# Patient Record
Sex: Female | Born: 1940 | Race: White | Hispanic: No | Smoking: Never smoker
Health system: Southern US, Community
[De-identification: ages and names within clinical notes are randomized; demographics above are authoritative.]

## PROBLEM LIST (undated history)

## (undated) DIAGNOSIS — K219 Gastro-esophageal reflux disease without esophagitis: Secondary | ICD-10-CM

## (undated) DIAGNOSIS — M1990 Unspecified osteoarthritis, unspecified site: Secondary | ICD-10-CM

## (undated) DIAGNOSIS — Z860100 Personal history of colon polyps, unspecified: Secondary | ICD-10-CM

## (undated) DIAGNOSIS — E785 Hyperlipidemia, unspecified: Secondary | ICD-10-CM

## (undated) DIAGNOSIS — I10 Essential (primary) hypertension: Secondary | ICD-10-CM

## (undated) DIAGNOSIS — E039 Hypothyroidism, unspecified: Secondary | ICD-10-CM

## (undated) DIAGNOSIS — Z01419 Encounter for gynecological examination (general) (routine) without abnormal findings: Secondary | ICD-10-CM

## (undated) HISTORY — DX: Encounter for gynecological examination (general) (routine) without abnormal findings: Z01.419

## (undated) HISTORY — DX: Essential (primary) hypertension: I10

## (undated) HISTORY — DX: Hypothyroidism, unspecified: E03.9

## (undated) HISTORY — PX: OTHER SURGICAL HISTORY: SHX169

## (undated) HISTORY — PX: BREAST LUMPECTOMY: SHX2

## (undated) HISTORY — DX: Gastro-esophageal reflux disease without esophagitis: K21.9

## (undated) HISTORY — DX: Hyperlipidemia, unspecified: E78.5

## (undated) HISTORY — DX: Unspecified osteoarthritis, unspecified site: M19.90

## (undated) HISTORY — DX: Personal history of colon polyps, unspecified: Z86.0100

## (undated) HISTORY — PX: OOPHORECTOMY: SHX86

## (undated) HISTORY — DX: Personal history of colonic polyps: Z86.010

## (undated) HISTORY — PX: ABDOMINAL HYSTERECTOMY: SHX81

---

## 1998-01-08 ENCOUNTER — Other Ambulatory Visit: Admission: RE | Admit: 1998-01-08 | Discharge: 1998-01-08 | Payer: Self-pay | Admitting: *Deleted

## 1998-09-29 ENCOUNTER — Emergency Department (HOSPITAL_COMMUNITY): Admission: EM | Admit: 1998-09-29 | Discharge: 1998-09-29 | Payer: Self-pay | Admitting: Emergency Medicine

## 1998-10-16 ENCOUNTER — Emergency Department (HOSPITAL_COMMUNITY): Admission: EM | Admit: 1998-10-16 | Discharge: 1998-10-16 | Payer: Self-pay | Admitting: Emergency Medicine

## 1998-10-18 ENCOUNTER — Emergency Department (HOSPITAL_COMMUNITY): Admission: EM | Admit: 1998-10-18 | Discharge: 1998-10-18 | Payer: Self-pay | Admitting: Emergency Medicine

## 1999-05-21 ENCOUNTER — Other Ambulatory Visit: Admission: RE | Admit: 1999-05-21 | Discharge: 1999-05-21 | Payer: Self-pay | Admitting: *Deleted

## 2000-01-20 ENCOUNTER — Other Ambulatory Visit: Admission: RE | Admit: 2000-01-20 | Discharge: 2000-01-20 | Payer: Self-pay | Admitting: *Deleted

## 2000-08-04 ENCOUNTER — Other Ambulatory Visit: Admission: RE | Admit: 2000-08-04 | Discharge: 2000-08-04 | Payer: Self-pay | Admitting: *Deleted

## 2002-01-01 ENCOUNTER — Emergency Department (HOSPITAL_COMMUNITY): Admission: EM | Admit: 2002-01-01 | Discharge: 2002-01-02 | Payer: Self-pay | Admitting: Emergency Medicine

## 2004-09-29 ENCOUNTER — Ambulatory Visit: Payer: Self-pay | Admitting: Family Medicine

## 2006-12-06 ENCOUNTER — Encounter: Payer: Self-pay | Admitting: Family Medicine

## 2006-12-06 DIAGNOSIS — Z860100 Personal history of colon polyps, unspecified: Secondary | ICD-10-CM | POA: Insufficient documentation

## 2006-12-06 DIAGNOSIS — M19019 Primary osteoarthritis, unspecified shoulder: Secondary | ICD-10-CM | POA: Insufficient documentation

## 2006-12-06 DIAGNOSIS — D869 Sarcoidosis, unspecified: Secondary | ICD-10-CM | POA: Insufficient documentation

## 2006-12-06 DIAGNOSIS — E0590 Thyrotoxicosis, unspecified without thyrotoxic crisis or storm: Secondary | ICD-10-CM | POA: Insufficient documentation

## 2006-12-06 DIAGNOSIS — I10 Essential (primary) hypertension: Secondary | ICD-10-CM | POA: Insufficient documentation

## 2006-12-06 DIAGNOSIS — M810 Age-related osteoporosis without current pathological fracture: Secondary | ICD-10-CM | POA: Insufficient documentation

## 2006-12-23 ENCOUNTER — Ambulatory Visit: Payer: Self-pay | Admitting: Family Medicine

## 2006-12-23 DIAGNOSIS — E039 Hypothyroidism, unspecified: Secondary | ICD-10-CM | POA: Insufficient documentation

## 2006-12-28 LAB — CONVERTED CEMR LAB
ALT: 18 units/L (ref 0–35)
AST: 22 units/L (ref 0–37)
Albumin: 4.4 g/dL (ref 3.5–5.2)
Alkaline Phosphatase: 70 units/L (ref 39–117)
BUN: 12 mg/dL (ref 6–23)
Basophils Absolute: 0 10*3/uL (ref 0.0–0.1)
Basophils Relative: 0.1 % (ref 0.0–1.0)
Bilirubin, Direct: 0.1 mg/dL (ref 0.0–0.3)
CO2: 29 meq/L (ref 19–32)
Calcium: 9.7 mg/dL (ref 8.4–10.5)
Chloride: 102 meq/L (ref 96–112)
Cholesterol: 254 mg/dL (ref 0–200)
Creatinine, Ser: 0.8 mg/dL (ref 0.4–1.2)
Direct LDL: 179.8 mg/dL
Eosinophils Absolute: 0.1 10*3/uL (ref 0.0–0.6)
Eosinophils Relative: 1.6 % (ref 0.0–5.0)
GFR calc Af Amer: 92 mL/min
GFR calc non Af Amer: 76 mL/min
Glucose, Bld: 106 mg/dL — ABNORMAL HIGH (ref 70–99)
HCT: 40.5 % (ref 36.0–46.0)
HDL: 44.8 mg/dL (ref >39.0)
Hemoglobin: 14.1 g/dL (ref 12.0–15.0)
Lymphocytes Relative: 36 % (ref 12.0–46.0)
MCHC: 34.9 g/dL (ref 30.0–36.0)
MCV: 90.6 fL (ref 78.0–100.0)
Monocytes Absolute: 0.5 10*3/uL (ref 0.2–0.7)
Monocytes Relative: 10.7 % (ref 3.0–11.0)
Neutro Abs: 2.7 10*3/uL (ref 1.4–7.7)
Neutrophils Relative %: 51.6 % (ref 43.0–77.0)
Platelets: 224 10*3/uL (ref 150–400)
Potassium: 3.5 meq/L (ref 3.5–5.1)
RBC: 4.47 M/uL (ref 3.87–5.11)
RDW: 11.2 % — ABNORMAL LOW (ref 11.5–14.6)
Sodium: 138 meq/L (ref 135–145)
TSH: 1.57 microintl units/mL (ref 0.35–5.50)
Total Bilirubin: 0.8 mg/dL (ref 0.3–1.2)
Total CHOL/HDL Ratio: 5.7
Total Protein: 7.6 g/dL (ref 6.0–8.3)
Triglycerides: 165 mg/dL — ABNORMAL HIGH (ref 0–149)
VLDL: 33 mg/dL (ref 0–40)
WBC: 5.1 10*3/uL (ref 4.5–10.5)

## 2007-01-06 ENCOUNTER — Telehealth: Payer: Self-pay | Admitting: Family Medicine

## 2007-01-24 ENCOUNTER — Ambulatory Visit: Payer: Self-pay | Admitting: Family Medicine

## 2007-06-20 ENCOUNTER — Ambulatory Visit: Payer: Self-pay | Admitting: Family Medicine

## 2007-06-20 DIAGNOSIS — K219 Gastro-esophageal reflux disease without esophagitis: Secondary | ICD-10-CM | POA: Insufficient documentation

## 2007-07-18 ENCOUNTER — Ambulatory Visit: Payer: Self-pay | Admitting: Family Medicine

## 2008-02-07 ENCOUNTER — Ambulatory Visit: Payer: Self-pay | Admitting: Family Medicine

## 2008-06-14 ENCOUNTER — Ambulatory Visit: Payer: Self-pay | Admitting: Family Medicine

## 2008-06-14 DIAGNOSIS — I499 Cardiac arrhythmia, unspecified: Secondary | ICD-10-CM | POA: Insufficient documentation

## 2008-07-03 ENCOUNTER — Ambulatory Visit: Payer: Self-pay | Admitting: Family Medicine

## 2008-07-04 LAB — CONVERTED CEMR LAB
ALT: 17 units/L (ref 0–35)
AST: 19 units/L (ref 0–37)
Albumin: 4.3 g/dL (ref 3.5–5.2)
Alkaline Phosphatase: 67 units/L (ref 39–117)
BUN: 22 mg/dL (ref 6–23)
Basophils Absolute: 0 10*3/uL (ref 0.0–0.1)
Basophils Relative: 0.2 % (ref 0.0–3.0)
Bilirubin, Direct: 0.1 mg/dL (ref 0.0–0.3)
CO2: 29 meq/L (ref 19–32)
Calcium: 9.8 mg/dL (ref 8.4–10.5)
Chloride: 105 meq/L (ref 96–112)
Cholesterol: 257 mg/dL (ref 0–200)
Creatinine, Ser: 0.9 mg/dL (ref 0.4–1.2)
Direct LDL: 175.8 mg/dL
Eosinophils Absolute: 0.2 10*3/uL (ref 0.0–0.7)
Eosinophils Relative: 3 % (ref 0.0–5.0)
GFR calc Af Amer: 80 mL/min
GFR calc non Af Amer: 66 mL/min
Glucose, Bld: 101 mg/dL — ABNORMAL HIGH (ref 70–99)
HCT: 40.6 % (ref 36.0–46.0)
HDL: 40.6 mg/dL (ref >39.0)
Hemoglobin: 13.9 g/dL (ref 12.0–15.0)
Lymphocytes Relative: 44.8 % (ref 12.0–46.0)
MCHC: 34.2 g/dL (ref 30.0–36.0)
MCV: 92 fL (ref 78.0–100.0)
Monocytes Absolute: 0.6 10*3/uL (ref 0.1–1.0)
Monocytes Relative: 12.1 % — ABNORMAL HIGH (ref 3.0–12.0)
Neutro Abs: 2.1 10*3/uL (ref 1.4–7.7)
Neutrophils Relative %: 39.9 % — ABNORMAL LOW (ref 43.0–77.0)
Platelets: 203 10*3/uL (ref 150–400)
Potassium: 3.9 meq/L (ref 3.5–5.1)
RBC: 4.41 M/uL (ref 3.87–5.11)
RDW: 11.2 % — ABNORMAL LOW (ref 11.5–14.6)
Sodium: 141 meq/L (ref 135–145)
TSH: 1.48 microintl units/mL (ref 0.35–5.50)
Total Bilirubin: 1 mg/dL (ref 0.3–1.2)
Total CHOL/HDL Ratio: 6.3
Total Protein: 7.3 g/dL (ref 6.0–8.3)
Triglycerides: 160 mg/dL — ABNORMAL HIGH (ref 0–149)
VLDL: 32 mg/dL (ref 0–40)
WBC: 5.2 10*3/uL (ref 4.5–10.5)

## 2008-07-17 ENCOUNTER — Ambulatory Visit: Payer: Self-pay | Admitting: Cardiology

## 2008-11-28 ENCOUNTER — Telehealth (INDEPENDENT_AMBULATORY_CARE_PROVIDER_SITE_OTHER): Payer: Self-pay | Admitting: *Deleted

## 2009-01-16 ENCOUNTER — Ambulatory Visit: Payer: Self-pay | Admitting: Family Medicine

## 2009-01-30 ENCOUNTER — Ambulatory Visit: Payer: Self-pay | Admitting: Family Medicine

## 2009-06-21 ENCOUNTER — Telehealth: Payer: Self-pay | Admitting: Family Medicine

## 2010-01-15 ENCOUNTER — Telehealth: Payer: Self-pay | Admitting: Family Medicine

## 2010-01-22 ENCOUNTER — Ambulatory Visit: Payer: Self-pay | Admitting: Family Medicine

## 2010-05-29 NOTE — Assessment & Plan Note (Signed)
Summary: 1 MONTH FUP/PER DR/CCM   Vital Signs:  Patient Profile:   70 Years Old Female Weight:      115 pounds Temp:     98.2 degrees F oral Pulse rate:   40 / minute Pulse rhythm:   regular BP sitting:   142 / 84  (left arm)  Vitals Entered By: Alfred Levins, CMA (January 24, 2007 10:04 AM)                 Chief Complaint:  f/u on meds.  History of Present Illness: Here to follow up BP. We recently added Toprol XL to her Lotensin  HCT. Feels much better, and BP is much better.  Current Allergies: ! PENICILLIN ! SULFA ! DOXYCYCLINE ! * MONOGESIC ! * HYCOSAMINE ! * IOPHAN COUGH SYRUP  Past Medical History:    Reviewed history from 12/23/2006 and no changes required:       Osteoporosis       Hypertension       Hypothyroidism     Review of Systems  The patient denies anorexia, fever, weight loss, vision loss, decreased hearing, hoarseness, chest pain, syncope, dyspnea on exhertion, peripheral edema, prolonged cough, hemoptysis, abdominal pain, melena, hematochezia, severe indigestion/heartburn, hematuria, incontinence, genital sores, muscle weakness, suspicious skin lesions, transient blindness, difficulty walking, depression, unusual weight change, abnormal bleeding, enlarged lymph nodes, angioedema, breast masses, and testicular masses.     Physical Exam  General:     Well-developed,well-nourished,in no acute distress; alert,appropriate and cooperative throughout examination    Impression & Recommendations:  Problem # 1:  HYPERTENSION (ICD-401.9) Assessment: Improved  Her updated medication list for this problem includes:    Lotensin Hct 20-12.5 Mg Tabs (Benazepril-hydrochlorothiazide) ..... Once daily    Toprol Xl 50 Mg Tb24 (Metoprolol succinate) .Marland Kitchen... 1 by mouth once daily   Problem # 2:  HYPOTHYROIDISM (ICD-244.9) Assessment: Unchanged  Her updated medication list for this problem includes:    Synthroid 50 Mcg Tabs (Levothyroxine sodium) .Marland Kitchen... 1  by mouth once daily   Complete Medication List: 1)  Synthroid 50 Mcg Tabs (Levothyroxine sodium) .Marland Kitchen.. 1 by mouth once daily 2)  Centrum Tabs (Multiple vitamins-minerals) .Marland Kitchen.. 1 by mouth once daily 3)  Lotensin Hct 20-12.5 Mg Tabs (Benazepril-hydrochlorothiazide) .... Once daily 4)  Bayer Aspirin 325 Mg Tabs (Aspirin) .... Once daily 5)  Toprol Xl 50 Mg Tb24 (Metoprolol succinate) .Marland Kitchen.. 1 by mouth once daily   Patient Instructions: 1)  Please schedule a follow-up appointment as needed.    Prescriptions: SYNTHROID 50 MCG TABS (LEVOTHYROXINE SODIUM) 1 by mouth once daily  #30 x 11   Entered and Authorized by:   Nelwyn Salisbury MD   Signed by:   Nelwyn Salisbury MD on 01/24/2007   Method used:   Electronically sent to ...       [facility] [phone][facility]       [address]       [place], [place]  [address]       Ph: [phone] or [phone]       Fax: [phone]   RxID:   [id number]  ]

## 2010-05-29 NOTE — Assessment & Plan Note (Signed)
Summary: 1 month rov/njr   Vital Signs:  Patient Profile:   70 Years Old Female Weight:      115 pounds Temp:     98.2 degrees F oral Pulse rate:   52 / minute Pulse rhythm:   regular BP sitting:   118 / 74  (left arm) Cuff size:   regular  Vitals Entered By: Alfred Levins, CMA (July 18, 2007 11:10 AM)                 Chief Complaint:  f/u on meds.  History of Present Illness: Was seen on 06-20-07 for heartburn which she felt was due to her taking aspirin and metoprolol. We stopped both of these, and she has felt fine ever since.     Current Allergies: ! PENICILLIN ! SULFA ! DOXYCYCLINE ! * MONOGESIC ! * HYCOSAMINE ! * IOPHAN COUGH SYRUP  Past Medical History:    Reviewed history from 12/23/2006 and no changes required:       Osteoporosis       Hypertension       Hypothyroidism       GERD     Review of Systems  The patient denies anorexia, fever, weight loss, weight gain, vision loss, decreased hearing, hoarseness, chest pain, syncope, dyspnea on exhertion, peripheral edema, prolonged cough, hemoptysis, abdominal pain, melena, hematochezia, severe indigestion/heartburn, hematuria, incontinence, genital sores, muscle weakness, suspicious skin lesions, transient blindness, difficulty walking, depression, unusual weight change, abnormal bleeding, enlarged lymph nodes, angioedema, breast masses, and testicular masses.     Physical Exam  General:     Well-developed,well-nourished,in no acute distress; alert,appropriate and cooperative throughout examination    Impression & Recommendations:  Problem # 1:  GERD (ICD-530.81)  Problem # 2:  HYPERTENSION (ICD-401.9)  Her updated medication list for this problem includes:    Lotensin Hct 20-12.5 Mg Tabs (Benazepril-hydrochlorothiazide) ..... Once daily   Complete Medication List: 1)  Centrum Tabs (Multiple vitamins-minerals) .Marland Kitchen.. 1 by mouth once daily 2)  Lotensin Hct 20-12.5 Mg Tabs  (Benazepril-hydrochlorothiazide) .... Once daily 3)  Synthroid 50 Mcg Tabs (Levothyroxine sodium) .Marland Kitchen.. 1 by mouth once daily   Patient Instructions: 1)  Please schedule a follow-up appointment as needed.    ]

## 2010-05-29 NOTE — Assessment & Plan Note (Signed)
Summary: flu shot/jls  Nurse Visit  Flu Vaccine Consent Questions     Do you have a history of severe allergic reactions to this vaccine? no    Any prior history of allergic reactions to egg and/or gelatin? no    Do you have a sensitivity to the preservative Thimersol? no    Do you have a past history of Guillan-Barre Syndrome? no    Do you currently have an acute febrile illness? no    Have you ever had a severe reaction to latex? no    Vaccine information given and explained to patient? yes    Are you currently pregnant? no    Lot Number:[id number]   Site Given  Left Deltoid IM Romualdo Bolk, CMA  February 07, 2008 1:14 PM   Prior Medications: CENTRUM   TABS (MULTIPLE VITAMINS-MINERALS) 1 by mouth once daily LOTENSIN HCT 20-12.5 MG  TABS (BENAZEPRIL-HYDROCHLOROTHIAZIDE) once daily SYNTHROID 50 MCG TABS (LEVOTHYROXINE SODIUM) 1 by mouth once daily Current Allergies: ! PENICILLIN ! SULFA ! DOXYCYCLINE ! * MONOGESIC ! * HYCOSAMINE ! * IOPHAN COUGH SYRUP    Orders Added: 1)  Flu Vaccine 44yrs + [04540] 2)  Administration Flu vaccine [G0008]    ]

## 2010-05-29 NOTE — Assessment & Plan Note (Signed)
Summary: colon problems/ mhf   Vital Signs:  Patient Profile:   70 Years Old Female Weight:      114 pounds Temp:     98.0 degrees F oral Pulse rate:   45 / minute BP sitting:   102 / 68  (left arm) Cuff size:   regular  Vitals Entered By: Alfred Levins, CMA (June 20, 2007 11:02 AM)                 Chief Complaint:  ulcer?Marland Kitchen  History of Present Illness: 2 weeks of epigastric burning. She thought it was due to her ASA and her Metoprolol use. She stopped these 3 days ago, and the burning stopped. No nausea or fever. BM's OK. Used some TUMS.     Current Allergies: ! PENICILLIN ! SULFA ! DOXYCYCLINE ! * MONOGESIC ! * HYCOSAMINE ! * IOPHAN COUGH SYRUP  Past Medical History:    Reviewed history from 12/23/2006 and no changes required:       Osteoporosis       Hypertension       Hypothyroidism  Past Surgical History:    Reviewed history from 12/06/2006 and no changes required:       Hysterectomy       Oophorectomy       Lumpectomy bilateral breast     Review of Systems      See HPI   Physical Exam  General:     Well-developed,well-nourished,in no acute distress; alert,appropriate and cooperative throughout examination Abdomen:     Bowel sounds positive,abdomen soft and non-tender without masses, organomegaly or hernias noted.    Impression & Recommendations:  Problem # 1:  GERD (ICD-530.81)  Problem # 2:  HYPERTENSION (ICD-401.9)  The following medications were removed from the medication list:    Toprol Xl 50 Mg Tb24 (Metoprolol succinate) .Marland Kitchen... 1 by mouth once daily  Her updated medication list for this problem includes:    Lotensin Hct 20-12.5 Mg Tabs (Benazepril-hydrochlorothiazide) ..... Once daily   Complete Medication List: 1)  Centrum Tabs (Multiple vitamins-minerals) .Marland Kitchen.. 1 by mouth once daily 2)  Lotensin Hct 20-12.5 Mg Tabs (Benazepril-hydrochlorothiazide) .... Once daily 3)  Synthroid 50 Mcg Tabs (Levothyroxine sodium) .Marland Kitchen.. 1 by  mouth once daily   Patient Instructions: 1)  Stop ASA and metoprolol for now. Use Prilosec OTC as needed . We will watch her BP closely, but I doubt she will miss the metoprolol much.  2)  Please schedule a follow-up appointment in 1 month.    ]

## 2010-05-29 NOTE — Miscellaneous (Signed)
  Clinical Lists Changes  Medications: Changed medication from LOTENSIN 10 MG TABS (BENAZEPRIL HCL) to LOTENSIN 10 MG TABS (BENAZEPRIL HCL) 1 by mouth once daily needs ov - Signed Rx of LOTENSIN 10 MG TABS (BENAZEPRIL HCL) 1 by mouth once daily needs ov;  #30 x 0;  Signed;  Entered by: Alfred Levins, CMA;  Authorized by: Nelwyn Salisbury MD;  Method used: Electronic    Prescriptions: LOTENSIN 10 MG TABS (BENAZEPRIL HCL) 1 by mouth once daily needs ov  #30 x 0   Entered by:   Alfred Levins, CMA   Authorized by:   Nelwyn Salisbury MD   Signed by:   Alfred Levins, CMA on 12/06/2006   Method used:   [facility] ...       [facility] [address]       [address]       [place], [place]  [address]       Ph: [phone] or [phone]       Fax: [phone]   RxID:   [id number]

## 2010-05-29 NOTE — Assessment & Plan Note (Signed)
Summary: pt will come in fasting/ok per doc/njr   Vital Signs:  Patient profile:   70 year old female Height:      61 inches Weight:      112 pounds BMI:     21.24 Temp:     97.9 degrees F oral Pulse rate:   70 / minute Pulse rhythm:   regular BP sitting:   102 / 60  (left arm) Cuff size:   regular  Vitals Entered By: Alfred Levins, CMA (July 03, 2008 8:25 AM) Is Patient Diabetic? No   History of Present Illness: 70 yr old female for cpx. She feels great and has no concerns. We saw her last month with an elevated BP and she was experiencing some palpitations which we felt were frequent PVC's. After adding low dose Metoprolol, she now feels fine with no skipping. Her BP is now  well controlled. She admits to not seeing her GYN for an exam for several years. Also her last colonoscopy was about 10 years ago. She has a hx of colon polyps, but her last colonoscopy was clear. She attempted to get one about 5 years ago, but the prep caused her to get bad cramps and vomiting so she never got it done. She is very hesitant to take any sort of prep now. She does describe some of the stresses she has at home, with taking care of a spouse with dementia.   Preventive Screening-Counseling & Management     Smoking Status: never      Drug Use:  no.    Allergies: 1)  ! Penicillin 2)  ! Sulfa 3)  ! Doxycycline 4)  ! * Monogesic 5)  ! * Hycosamine 6)  ! * Iophan Cough Syrup  Past Medical History:    Reviewed history from 07/18/2007 and no changes required:       Osteoporosis       Hypertension       Hypothyroidism       GERD       Colonic polyps, hx of       sees [facility] for exams  Past Surgical History:    Reviewed history from 12/06/2006 and no changes required:       Hysterectomy       Oophorectomy       Lumpectomy bilateral breasts       bladder tack procedure       colonoscopy per Dr. Marina Goodell around 2000, clear  Family History:    Family History of Colon CA 1st degree  relative <60    Family History of CAD Female 1st degree relative <50    Family History Diabetes 1st degree relative    Family History Hypertension    Family History of Prostate CA 1st degree relative <50    Diverticulitis- mother  Social History:    Married    Never Smoked    Alcohol use-no    Drug use-no    Drug Use:  no  Physical Exam  General:  Well-developed,well-nourished,in no acute distress; alert,appropriate and cooperative throughout examination Head:  Normocephalic and atraumatic without obvious abnormalities. No apparent alopecia or balding. Eyes:  No corneal or conjunctival inflammation noted. EOMI. Perrla. Funduscopic exam benign, without hemorrhages, exudates or papilledema. Vision grossly normal. Ears:  External ear exam shows no significant lesions or deformities.  Otoscopic examination reveals clear canals, tympanic membranes are intact bilaterally without bulging, retraction, inflammation or discharge. Hearing is grossly normal bilaterally. Nose:  External  nasal examination shows no deformity or inflammation. Nasal mucosa are pink and moist without lesions or exudates. Mouth:  Oral mucosa and oropharynx without lesions or exudates.  Teeth in good repair. Neck:  No deformities, masses, or tenderness noted. Chest Wall:  No deformities, masses, or tenderness noted. Lungs:  Normal respiratory effort, chest expands symmetrically. Lungs are clear to auscultation, no crackles or wheezes. Heart:  Normal rate and regular rhythm. S1 and S2 normal without gallop, murmur, click, rub or other extra sounds. EKG normal Abdomen:  Bowel sounds positive,abdomen soft and non-tender without masses, organomegaly or hernias noted. Msk:  No deformity or scoliosis noted of thoracic or lumbar spine.   Pulses:  R and L carotid,radial,femoral,dorsalis pedis and posterior tibial pulses are full and equal bilaterally Extremities:  No clubbing, cyanosis, edema, or deformity noted with normal full  range of motion of all joints.   Neurologic:  No cranial nerve deficits noted. Station and gait are normal. Plantar reflexes are down-going bilaterally. DTRs are symmetrical throughout. Sensory, motor and coordinative functions appear intact. Skin:  Intact without suspicious lesions or rashes Cervical Nodes:  No lymphadenopathy noted Axillary Nodes:  No palpable lymphadenopathy Inguinal Nodes:  No significant adenopathy Psych:  Cognition and judgment appear intact. Alert and cooperative with normal attention span and concentration. No apparent delusions, illusions, hallucinations    Review of Systems  The patient denies anorexia, fever, weight loss, weight gain, vision loss, decreased hearing, hoarseness, chest pain, syncope, dyspnea on exertion, peripheral edema, prolonged cough, headaches, hemoptysis, abdominal pain, melena, hematochezia, severe indigestion/heartburn, hematuria, incontinence, genital sores, muscle weakness, suspicious skin lesions, transient blindness, difficulty walking, depression, unusual weight change, abnormal bleeding, enlarged lymph nodes, angioedema, and breast masses.    Impression & Recommendations:  Problem # 1:  HYPOTHYROIDISM (ICD-244.9)  Her updated medication list for this problem includes:    Synthroid 50 Mcg Tabs (Levothyroxine sodium) .Marland Kitchen... 1 by mouth once daily  Orders: TLB-TSH (Thyroid Stimulating Hormone) (84443-TSH)  Problem # 2:  COLONIC POLYPS, HX OF (ICD-V12.72)  Problem # 3:  HYPERTENSION (ICD-401.9)  Her updated medication list for this problem includes:    Lotensin Hct 20-12.5 Mg Tabs (Benazepril-hydrochlorothiazide) ..... Once daily    Metoprolol Succinate 25 Mg Xr24h-tab (Metoprolol succinate) ..... Once daily  Orders: UA Dipstick w/o Micro (automated)  (81003) EKG w/ Interpretation (93000) Venipuncture (95621) TLB-Lipid Panel (80061-LIPID) TLB-BMP (Basic Metabolic Panel-BMET) (80048-METABOL) TLB-CBC Platelet - w/Differential  (85025-CBCD) TLB-Hepatic/Liver Function Pnl (80076-HEPATIC)  Complete Medication List: 1)  Centrum Tabs (Multiple vitamins-minerals) .Marland Kitchen.. 1 by mouth once daily 2)  Lotensin Hct 20-12.5 Mg Tabs (Benazepril-hydrochlorothiazide) .... Once daily 3)  Synthroid 50 Mcg Tabs (Levothyroxine sodium) .Marland Kitchen.. 1 by mouth once daily 4)  Vitamin E 600 Unit Caps (Vitamin e) .Marland Kitchen.. 1 by mouth once daily 5)  Calcium Carbonate-vitamin D 600-400 Mg-unit Tabs (Calcium carbonate-vitamin d) .Marland Kitchen.. 1 by mouth once daily 6)  Metoprolol Succinate 25 Mg Xr24h-tab (Metoprolol succinate) .... Once daily  Patient Instructions: 1)  Get fasting labs today. She will contact [facility] soon for her mammogram and [facility] for her exam. We will set up a consultation with Dr. Marina Goodell to go over her options for looking at her colon again. She is set up to see Dr. Daleen Squibb on 07-17-08.   Prescriptions: SYNTHROID 50 MCG TABS (LEVOTHYROXINE SODIUM) 1 by mouth once daily  #30 x 11   Entered and Authorized by:   Nelwyn Salisbury MD   Signed by:   Nelwyn Salisbury MD  on 07/03/2008   Method used:   Electronically to        [facility] [phone]* (retail)       [address]       [place], [place]  [address]       Ph: [phone] or [phone]       Fax: [phone]   RxID:   [id number]         Appended Document: pt will come in fasting/ok per doc/njr     Allergies: 1)  ! Penicillin 2)  ! Sulfa 3)  ! Doxycycline 4)  ! * Monogesic 5)  ! * Hycosamine 6)  ! * Iophan Cough Syrup   Complete Medication List: 1)  Centrum Tabs (Multiple vitamins-minerals) .Marland Kitchen.. 1 by mouth once daily 2)  Lotensin Hct 20-12.5 Mg Tabs (Benazepril-hydrochlorothiazide) .... Once daily 3)  Synthroid 50 Mcg Tabs (Levothyroxine sodium) .Marland Kitchen.. 1 by mouth once daily 4)  Vitamin E 600 Unit Caps (Vitamin e) .Marland Kitchen.. 1 by mouth once daily 5)  Calcium Carbonate-vitamin D 600-400 Mg-unit Tabs (Calcium carbonate-vitamin d)  .Marland Kitchen.. 1 by mouth once daily 6)  Metoprolol Succinate 25 Mg Xr24h-tab (Metoprolol succinate) .... Once daily        Laboratory Results   Urine Tests  Date/Time Recieved: July 03, 2008 2:58 PM  Date/Time Reported: July 03, 2008 2:58 PM   Routine Urinalysis   Color: yellow Appearance: Clear Glucose: negative   (Normal Range: Negative) Bilirubin: negative   (Normal Range: Negative) Ketone: negative   (Normal Range: Negative) Spec. Gravity: 1.020   (Normal Range: 1.003-1.035) Blood: negative   (Normal Range: Negative) pH: 5.5   (Normal Range: 5.0-8.0) Protein: negative   (Normal Range: Negative) Urobilinogen: 0.2   (Normal Range: 0-1) Nitrite: negative   (Normal Range: Negative) Leukocyte Esterace: trace   (Normal Range: Negative)

## 2010-05-29 NOTE — Assessment & Plan Note (Signed)
Summary: 2 WK ROVC/NJR   Vital Signs:  Patient profile:   70 year old female Weight:      115 pounds Temp:     98.0 degrees F oral Pulse rate:   85 / minute BP sitting:   104 / 72  (left arm) Cuff size:   regular  Vitals Entered By: Alfred Levins, CMA (January 30, 2009 10:41 AM) CC: f/u on bp   History of Present Illness: here to recheck her BP. At our last visit we doubled her dose of metoprolol, and this has worked well. Her BP is stable, and the HAs have stopped.   Allergies: 1)  ! Penicillin 2)  ! Sulfa 3)  ! Doxycycline 4)  ! * Monogesic 5)  ! * Hycosamine 6)  ! * Iophan Cough Syrup 7)  ! * Vibermician  Past History:  Past Medical History: Reviewed history from 07/03/2008 and no changes required. Osteoporosis Hypertension Hypothyroidism GERD Colonic polyps, hx of sees [facility] for exams  Review of Systems  The patient denies anorexia, fever, weight loss, weight gain, vision loss, decreased hearing, hoarseness, chest pain, syncope, dyspnea on exertion, peripheral edema, prolonged cough, headaches, hemoptysis, abdominal pain, melena, hematochezia, severe indigestion/heartburn, hematuria, incontinence, genital sores, muscle weakness, suspicious skin lesions, transient blindness, difficulty walking, depression, unusual weight change, abnormal bleeding, enlarged lymph nodes, angioedema, breast masses, and testicular masses.    Physical Exam  General:  Well-developed,well-nourished,in no acute distress; alert,appropriate and cooperative throughout examination Neck:  No deformities, masses, or tenderness noted. Lungs:  Normal respiratory effort, chest expands symmetrically. Lungs are clear to auscultation, no crackles or wheezes. Heart:  Normal rate and regular rhythm. S1 and S2 normal without gallop, murmur, click, rub or other extra sounds.   Impression & Recommendations:  Problem # 1:  HYPERTENSION (ICD-401.9)  Her updated medication list for this problem  includes:    Lotensin Hct 20-12.5 Mg Tabs (Benazepril-hydrochlorothiazide) ..... Once daily    Metoprolol Succinate 50 Mg Xr24h-tab (Metoprolol succinate) ..... Once daily  Complete Medication List: 1)  Centrum Tabs (Multiple vitamins-minerals) .Marland Kitchen.. 1 by mouth once daily 2)  Lotensin Hct 20-12.5 Mg Tabs (Benazepril-hydrochlorothiazide) .... Once daily 3)  Synthroid 50 Mcg Tabs (Levothyroxine sodium) .Marland Kitchen.. 1 by mouth once daily 4)  Vitamin E 600 Unit Caps (Vitamin e) .Marland Kitchen.. 1 by mouth once daily 5)  Calcium Carbonate-vitamin D 600-400 Mg-unit Tabs (Calcium carbonate-vitamin d) .Marland Kitchen.. 1 by mouth once daily 6)  Metoprolol Succinate 50 Mg Xr24h-tab (Metoprolol succinate) .... Once daily  Other Orders: Flu Vaccine 4yrs + (04540) Administration Flu vaccine - MCR (J8119)  Patient Instructions: 1)  Please schedule a follow-up appointment in 3 months .  Flu Vaccine Consent Questions     Do you have a history of severe allergic reactions to this vaccine? no    Any prior history of allergic reactions to egg and/or gelatin? no    Do you have a sensitivity to the preservative Thimersol? no    Do you have a past history of Guillan-Barre Syndrome? no    Do you currently have an acute febrile illness? no    Have you ever had a severe reaction to latex? no    Vaccine information given and explained to patient? yes    Are you currently pregnant? no    Lot Number:[id number]   Exp Date:10/24/2009   Site Given  Left Deltoid IMbmedflu

## 2010-05-29 NOTE — Progress Notes (Signed)
Summary: UTI  Phone Note Call from Patient   Summary of Call: Patient requesting something for UTI. Patient c/o burning with urination. Patient also would like a Diflucan tablet for yeast after taking ATB. Patient is allergic to Sulfa. [facility] [address] [place]. Patient can be reached at [phone]. Initial call taken by: Darra Lis RMA,  November 28, 2008 2:06 PM  Follow-up for Phone Call        call in Cipro 500 mg two times a day for 7 days, also Diflucan 150 mg #1 with 5 rf Follow-up by: Nelwyn Salisbury MD,  November 28, 2008 2:45 PM  Additional Follow-up for Phone Call Additional follow up Details #1::        Patient informed. Additional Follow-up by: Darra Lis RMA,  November 28, 2008 3:27 PM    New/Updated Medications: CIPRO 500 MG TABS (CIPROFLOXACIN HCL) one tablet two times a day for 7 days. DIFLUCAN 150 MG TABS (FLUCONAZOLE) take one tablet after using antibiotics. Prescriptions: DIFLUCAN 150 MG TABS (FLUCONAZOLE) take one tablet after using antibiotics.  #1 x 5   Entered by:   Darra Lis RMA   Authorized by:   Nelwyn Salisbury MD   Signed by:   Darra Lis RMA on 11/28/2008   Method used:   Electronically to        [facility]. [phone]* (retail)       [address]       [place], [place]  [address]       Ph: [phone] or [phone]       Fax: [phone]   RxID:   [id number] CIPRO 500 MG TABS (CIPROFLOXACIN HCL) one tablet two times a day for 7 days.  #14 x 0   Entered by:   Darra Lis RMA   Authorized by:   Nelwyn Salisbury MD   Signed by:   Darra Lis RMA on 11/28/2008   Method used:   Electronically to        [facility] [phone]* (retail)       [address]       [place], [place]  [address]       Ph: [phone] or [phone]       Fax: [phone]   RxID:   [id number]

## 2010-05-29 NOTE — Assessment & Plan Note (Signed)
Summary: blood pressure elevated/mhf   Vital Signs:  Patient Profile:   70 Years Old Female Weight:      112 pounds Temp:     97.8 degrees F oral Pulse rate:   76 / minute Pulse rhythm:   irregular BP sitting:   152 / 84  (left arm) Cuff size:   regular  Vitals Entered By: Alfred Levins, CMA (June 14, 2008 10:37 AM)                 Chief Complaint:  bp check.  History of Present Illness: Here for what she thinks is high BP. We have not seen her since 3-09, when her BP was stable. In the past week however she has had episodes of mild HA's and blurred vision. No chest pain or SOB or palpitations. She has been under a lot of stress this year since she moved her elderly mother who has Alzheimers into her home. She had a normal eye exam 3 months ago. On her last EKG here in 2008 she had very frequent PVC's.     Current Allergies: ! PENICILLIN ! SULFA ! DOXYCYCLINE ! * MONOGESIC ! * HYCOSAMINE ! * IOPHAN COUGH SYRUP  Past Medical History:    Reviewed history from 07/18/2007 and no changes required:       Osteoporosis       Hypertension       Hypothyroidism       GERD     Review of Systems  The patient denies anorexia, fever, weight loss, weight gain, vision loss, decreased hearing, hoarseness, chest pain, syncope, dyspnea on exertion, peripheral edema, prolonged cough, hemoptysis, abdominal pain, melena, hematochezia, severe indigestion/heartburn, hematuria, incontinence, genital sores, muscle weakness, suspicious skin lesions, transient blindness, difficulty walking, depression, unusual weight change, abnormal bleeding, enlarged lymph nodes, angioedema, breast masses, and testicular masses.     Physical Exam  General:     Well-developed,well-nourished,in no acute distress; alert,appropriate and cooperative throughout examination Head:     Normocephalic and atraumatic without obvious abnormalities. No apparent alopecia or balding. Eyes:     No corneal or  conjunctival inflammation noted. EOMI. Perrla. Funduscopic exam benign, without hemorrhages, exudates or papilledema. Vision grossly normal. Ears:     External ear exam shows no significant lesions or deformities.  Otoscopic examination reveals clear canals, tympanic membranes are intact bilaterally without bulging, retraction, inflammation or discharge. Hearing is grossly normal bilaterally. Nose:     External nasal examination shows no deformity or inflammation. Nasal mucosa are pink and moist without lesions or exudates. Mouth:     Oral mucosa and oropharynx without lesions or exudates.  Teeth in good repair. Neck:     No deformities, masses, or tenderness noted. Lungs:     Normal respiratory effort, chest expands symmetrically. Lungs are clear to auscultation, no crackles or wheezes. Heart:     normal rate, no murmur, no gallop, no rub, and no JVD.  rhythm is quite irregular. EKG shows bigeminy Neurologic:     alert & oriented X3 and gait normal.      Impression & Recommendations:  Problem # 1:  VENTRICULAR ARRHYTHMIA (ICD-427.9)  Her updated medication list for this problem includes:    Metoprolol Succinate 25 Mg Xr24h-tab (Metoprolol succinate) ..... Once daily  Orders: Cardiology Referral (Cardiology)   Problem # 2:  HYPERTENSION (ICD-401.9)  Her updated medication list for this problem includes:    Lotensin Hct 20-12.5 Mg Tabs (Benazepril-hydrochlorothiazide) ..... Once daily    Metoprolol Succinate  25 Mg Xr24h-tab (Metoprolol succinate) ..... Once daily   Complete Medication List: 1)  Centrum Tabs (Multiple vitamins-minerals) .Marland Kitchen.. 1 by mouth once daily 2)  Lotensin Hct 20-12.5 Mg Tabs (Benazepril-hydrochlorothiazide) .... Once daily 3)  Synthroid 50 Mcg Tabs (Levothyroxine sodium) .Marland Kitchen.. 1 by mouth once daily 4)  Vitamin E 600 Unit Caps (Vitamin e) .Marland Kitchen.. 1 by mouth once daily 5)  Calcium Carbonate-vitamin D 600-400 Mg-unit Tabs (Calcium carbonate-vitamin d) .Marland Kitchen.. 1 by  mouth once daily 6)  Metoprolol Succinate 25 Mg Xr24h-tab (Metoprolol succinate) .... Once daily   Patient Instructions: 1)  This is worrisome even though she is not symptomatic. Will add low dose beta blocker to her ACE. She will see me again in 2 weeks for follow up and also for a cpx with fasting labs. Will refer her to see Cardiology soon as well.    Prescriptions: METOPROLOL SUCCINATE 25 MG XR24H-TAB (METOPROLOL SUCCINATE) once daily  #30 x 5   Entered and Authorized by:   Nelwyn Salisbury MD   Signed by:   Nelwyn Salisbury MD on 06/14/2008   Method used:   [facility] to        [facility] [phone]* (retail)       [address]       [place], [place]  [address]       Ph: [phone] or [phone]       Fax: [phone]   RxID:   [id number]

## 2010-05-29 NOTE — Assessment & Plan Note (Signed)
Summary: FU ON BP/NJR   Vital Signs:  Patient Profile:   70 Years Old Female Weight:      114 pounds (51.82 kg) Temp:     98.6 degrees F (37.00 degrees C) Pulse rate:   76 / minute Pulse rhythm:   irregular BP sitting:   158 / 92  (left arm)  Vitals Entered By: Alfred Levins, CMA (December 23, 2006 10:16 AM)                 Chief Complaint:  bp check.  History of Present Illness: 70 yr old female for follow up of HTN. She feels fine, denies any palpitations, SOB, chest pains, etc. Has not been here for two years. Checks her BP frequently and has noticed it beign up over the past month (systolics in 150's).  Current Allergies: ! PENICILLIN ! SULFA ! DOXYCYCLINE ! * MONOGESIC ! * HYCOSAMINE ! * IOPHAN COUGH SYRUP  Past Medical History:    Reviewed history from 12/06/2006 and no changes required:       Osteoporosis       Hypertension       Hypothyroidism     Review of Systems  The patient denies anorexia, fever, weight loss, vision loss, decreased hearing, hoarseness, chest pain, syncope, dyspnea on exhertion, peripheral edema, prolonged cough, hemoptysis, abdominal pain, melena, hematochezia, severe indigestion/heartburn, hematuria, incontinence, genital sores, muscle weakness, suspicious skin lesions, transient blindness, difficulty walking, depression, unusual weight change, abnormal bleeding, enlarged lymph nodes, angioedema, breast masses, and testicular masses.     Physical Exam  General:     Well-developed,well-nourished,in no acute distress; alert,appropriate and cooperative throughout examination Neck:     No deformities, masses, or tenderness noted. Lungs:     Normal respiratory effort, chest expands symmetrically. Lungs are clear to auscultation, no crackles or wheezes. Heart:     no murmur, no gallop, no rub, no JVD, and PMI normal.  Rate is normal, but rhythm is irregularly irregular. 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 Once daily   Problem # 2:  HYPERTENSION (ICD-401.9)  The following medications were removed from the medication list:    Lotensin 10 Mg Tabs (Benazepril hcl) .Marland Kitchen... 1 by mouth once daily needs ov  Her updated medication list for this problem includes:    Lotensin Hct 20-12.5 Mg Tabs (Benazepril-hydrochlorothiazide) ..... Once daily  Orders: EKG w/ Interpretation (93000) UA Dipstick w/o Micro (81002) TLB-Lipid Panel (80061-LIPID) TLB-BMP (Basic Metabolic Panel-BMET) (80048-METABOL) TLB-CBC Platelet - w/Differential (85025-CBCD) TLB-Hepatic/Liver Function Pnl (80076-HEPATIC)   Problem # 3:  HYPOTHYROIDISM (ICD-244.9) Assessment: Unchanged  Her updated medication list for this problem includes:    Synthroid 50 Mcg Tabs (Levothyroxine sodium) check TSH Orders: TLB-TSH (Thyroid Stimulating Hormone) (84443-TSH)   Complete Medication List: 1)  Synthroid 50 Mcg Tabs (Levothyroxine sodium) 2)  Centrum Tabs (Multiple vitamins-minerals) .Marland Kitchen.. 1 by mouth once daily 3)  Lotensin Hct 20-12.5 Mg Tabs (Benazepril-hydrochlorothiazide) .... Once daily 4)  Bayer Aspirin 325 Mg Tabs (Aspirin) .... Once daily   Patient Instructions: 1)  Please schedule a follow-up appointment in 1 month.    Prescriptions: LOTENSIN HCT 20-12.5 MG  TABS (BENAZEPRIL-HYDROCHLOROTHIAZIDE) once daily  #30 x 11   Entered and Authorized by:   Nelwyn Salisbury MD   Signed by:   Nelwyn Salisbury  MD on 12/23/2006   Method used:   Electronically sent to ...       [facility] [address]       [address]       [place], [place]  [address]       Ph: [phone] or [phone]       Fax: [phone]   RxID:   [id number]   Appended Document: Orders Update    Clinical Lists Changes  Orders: Added new  Service order of Venipuncture [phone]) - Signed Observations: Added new observation of COMMENTS: ..................................................................Marland KitchenWynona Canes, CMA  December 23, 2006 12:17 PM   (12/23/2006 11:49) Added new observation of PH URINE: 5.5  (12/23/2006 11:49) Added new observation of SPEC GR URIN: <1.005  (12/23/2006 11:49) Added new observation of APPEARANCE U: Clear  (12/23/2006 11:49) Added new observation of UA COLOR: yellow  (12/23/2006 11:49) Added new observation of WBC DIPSTK U: trace  (12/23/2006 11:49) Added new observation of NITRITE URN: negative  (12/23/2006 11:49) Added new observation of UROBILINOGEN: 0.2  (12/23/2006 11:49) Added new observation of PROTEIN, URN: negative  (12/23/2006 11:49) Added new observation of BLOOD UR DIP: negative  (12/23/2006 11:49) Added new observation of KETONES URN: negative  (12/23/2006 11:49) Added new observation of BILIRUBIN UR: negative  (12/23/2006 11:49) Added new observation of GLUCOSE, URN: negative  (12/23/2006 11:49)     Laboratory Results   Urine Tests   Date/Time Reported: December 23, 2006 12:17 PM   Routine Urinalysis   Color: yellow Appearance: Clear Glucose: negative   (Normal Range: Negative) Bilirubin: negative   (Normal Range: Negative) Ketone: negative   (Normal Range: Negative) Spec. Gravity: <1.005   (Normal Range: 1.003-1.035) Blood: negative   (Normal Range: Negative) pH: 5.5   (Normal Range: 5.0-8.0) Protein: negative   (Normal Range: Negative) Urobilinogen: 0.2   (Normal Range: 0-1) Nitrite: negative   (Normal Range: Negative) Leukocyte Esterace: trace   (Normal Range: Negative)    Comments: ..................................................................Marland KitchenWynona Canes, CMA  December 23, 2006 12:17 PM

## 2010-05-29 NOTE — Progress Notes (Signed)
Summary: rx metoprolol   Phone Note From Pharmacy   Caller: [facility] [phone]* Reason for Call: Needs renewal Summary of Call: refill metoprolol  Initial call taken by: Pura Spice, RN,  January 15, 2010 2:58 PM  Follow-up for Phone Call        ok per dr fry  Follow-up by: Pura Spice, RN,  January 15, 2010 2:58 PM    New/Updated Medications: METOPROLOL SUCCINATE 50 MG XR24H-TAB (METOPROLOL SUCCINATE) once daily Prescriptions: METOPROLOL SUCCINATE 50 MG XR24H-TAB (METOPROLOL SUCCINATE) once daily  #30 x 6   Entered by:   Pura Spice, RN   Authorized by:   Nelwyn Salisbury MD   Signed by:   Pura Spice, RN on 01/15/2010   Method used:   Electronically to        [facility]. [phone]* (retail)       [address]       [place], [place]  [address]       Ph: [phone] or [phone]       Fax: [phone]   RxID:   [id number]

## 2010-05-29 NOTE — Progress Notes (Signed)
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  Pt has started with low grade fever, sore throat, headache and cough this am.  Would like Tamiflu [facility] to [facility] ([place] [employer].  [phone] Initial call taken by: Lynann Beaver CMA,  June 21, 2009 9:17 AM  Follow-up for Phone Call        call in Tamiflu 75 mg two times a day for 7  days Follow-up by: Nelwyn Salisbury MD,  June 21, 2009 9:47 AM  Additional Follow-up for Phone Call Additional follow up Details #1::        Phone Call Completed, Rx Called In Additional Follow-up by: Alfred Levins, CMA,  June 21, 2009 9:52 AM    New/Updated Medications: TAMIFLU 75 MG CAPS (OSELTAMIVIR PHOSPHATE) Take one capsule by mouth twice a day Prescriptions: TAMIFLU 75 MG CAPS (OSELTAMIVIR PHOSPHATE) Take one capsule by mouth twice a day  #14 x 0   Entered by:   Alfred Levins, CMA   Authorized by:   Nelwyn Salisbury MD   Signed by:   Alfred Levins, CMA on 06/21/2009   Method used:   Electronically to        [facility]. [phone]* (retail)       [address]       [place], [place]  [address]       Ph: [phone] or [phone]       Fax: [phone]   RxID:   [id number]

## 2010-05-29 NOTE — Assessment & Plan Note (Signed)
Summary: flu shot/cjr  Nurse Visit  CC: Flu shot.kb   Allergies: 1)  ! Penicillin 2)  ! Sulfa 3)  ! Doxycycline 4)  ! * Monogesic 5)  ! * Hycosamine 6)  ! * Iophan Cough Syrup 7)  ! * Vibermician  Orders Added: 1)  Flu Vaccine 23yrs + MEDICARE PATIENTS [Q2039] 2)  Administration Flu vaccine - MCR [G0008]           Flu Vaccine Consent Questions     Do you have a history of severe allergic reactions to this vaccine? no    Any prior history of allergic reactions to egg and/or gelatin? no    Do you have a sensitivity to the preservative Thimersol? no    Do you have a past history of Guillan-Barre Syndrome? no    Do you currently have an acute febrile illness? no    Have you ever had a severe reaction to latex? no    Vaccine information given and explained to patient? yes    Are you currently pregnant? no    Lot Number:[id number]   Exp Date:10/25/2010   Site Given  Left Deltoid IMu

## 2010-05-29 NOTE — Assessment & Plan Note (Signed)
Summary: elevated B/P/LC   Vital Signs:  Patient profile:   70 year old female Weight:      113 pounds Temp:     98.6 degrees F oral Pulse rate:   54 / minute BP sitting:   152 / 84  (left arm) Cuff size:   regular  Vitals Entered By: Alfred Levins, CMA (January 16, 2009 11:23 AM) CC: hypertension   History of Present Illness: Here for a week of elevated BPs. She has had some HAs and some lightheadedness for several weeks. No chest pains or SOB or palpitations. She has taken an extra Metoprolol in the afternoon several times, and this has helped a lot. She feels better then, and her BP goes down.   Allergies: 1)  ! Penicillin 2)  ! Sulfa 3)  ! Doxycycline 4)  ! * Monogesic 5)  ! * Hycosamine 6)  ! * Iophan Cough Syrup 7)  ! * Vibermician  Past History:  Past Medical History: Reviewed history from 07/03/2008 and no changes required. Osteoporosis Hypertension Hypothyroidism GERD Colonic polyps, hx of sees [facility] for exams  Review of Systems  The patient denies anorexia, fever, weight loss, weight gain, vision loss, decreased hearing, hoarseness, chest pain, syncope, dyspnea on exertion, peripheral edema, prolonged cough, hemoptysis, abdominal pain, melena, hematochezia, severe indigestion/heartburn, hematuria, incontinence, genital sores, muscle weakness, suspicious skin lesions, transient blindness, difficulty walking, depression, unusual weight change, abnormal bleeding, enlarged lymph nodes, angioedema, breast masses, and testicular masses.    Physical Exam  General:  Well-developed,well-nourished,in no acute distress; alert,appropriate and cooperative throughout examination Neck:  No deformities, masses, or tenderness noted. Lungs:  Normal respiratory effort, chest expands symmetrically. Lungs are clear to auscultation, no crackles or wheezes. Heart:  Normal rate and regular rhythm. S1 and S2 normal without gallop, murmur, click, rub or other extra  sounds.   Impression & Recommendations:  Problem # 1:  HYPERTENSION (ICD-401.9)  The following medications were removed from the medication list:    Metoprolol Succinate 25 Mg Xr24h-tab (Metoprolol succinate) ..... Once daily Her updated medication list for this problem includes:    Lotensin Hct 20-12.5 Mg Tabs (Benazepril-hydrochlorothiazide) ..... Once daily    Metoprolol Succinate 50 Mg Xr24h-tab (Metoprolol succinate) ..... Once daily  Complete Medication List: 1)  Centrum Tabs (Multiple vitamins-minerals) .Marland Kitchen.. 1 by mouth once daily 2)  Lotensin Hct 20-12.5 Mg Tabs (Benazepril-hydrochlorothiazide) .... Once daily 3)  Synthroid 50 Mcg Tabs (Levothyroxine sodium) .Marland Kitchen.. 1 by mouth once daily 4)  Vitamin E 600 Unit Caps (Vitamin e) .Marland Kitchen.. 1 by mouth once daily 5)  Calcium Carbonate-vitamin D 600-400 Mg-unit Tabs (Calcium carbonate-vitamin d) .Marland Kitchen.. 1 by mouth once daily 6)  Metoprolol Succinate 50 Mg Xr24h-tab (Metoprolol succinate) .... Once daily  Patient Instructions: 1)  We will simply increase her dose of Metoprolol. 2)  Please schedule a follow-up appointment in 2 weeks.  Prescriptions: METOPROLOL SUCCINATE 50 MG XR24H-TAB (METOPROLOL SUCCINATE) once daily  #30 x 11   Entered and Authorized by:   Nelwyn Salisbury MD   Signed by:   Nelwyn Salisbury MD on 01/16/2009   Method used:   Electronically to        [facility]. [phone]* (retail)       [address]       [place], [place]  [address]       Ph: [phone] or [phone]  Fax: [phone]   RxID:   [id number]

## 2010-06-16 ENCOUNTER — Telehealth: Payer: Self-pay | Admitting: Family Medicine

## 2010-06-16 NOTE — Telephone Encounter (Signed)
She needs an OV to discuss this, since we have not seen her in well over a year

## 2010-06-16 NOTE — Telephone Encounter (Signed)
Pt called and stated that she cannot find the brand name Lotensin HCT-20-12.5mg  anywhere in the area. She would like something else( not the generic) [facility]---[phone]---phone.

## 2010-06-17 ENCOUNTER — Other Ambulatory Visit: Payer: Self-pay | Admitting: Family Medicine

## 2010-06-17 NOTE — Telephone Encounter (Signed)
Pt called to adv that her bp med (Lotensen ACT 20-12.5).... [facility] [place] [facility] pt that the med is not available at their store or any other stores in the area due to it being on back order till April.... [facility] [address], [place]...the patient has an appt with Dr Clent Ridges on 2/29 at 1pm for medication review... However, pt is currently out of medication.... Pt can be reached at [phone].

## 2010-06-17 NOTE — Telephone Encounter (Signed)
Pt aware.

## 2010-06-18 MED ORDER — LISINOPRIL-HYDROCHLOROTHIAZIDE 20-12.5 MG PO TABS: 1.0000 | ORAL_TABLET | Freq: Every day | ORAL | Status: DC

## 2010-06-18 NOTE — Telephone Encounter (Signed)
Called to [facility] [address] and pt aware  rx for lisinopril hct 20/12.5

## 2010-06-18 NOTE — Telephone Encounter (Signed)
Change to Lisinopril HCT 20/12.5 per day, call in one year supply

## 2010-06-20 ENCOUNTER — Inpatient Hospital Stay (INDEPENDENT_AMBULATORY_CARE_PROVIDER_SITE_OTHER)
Admission: RE | Admit: 2010-06-20 | Discharge: 2010-06-20 | Disposition: A | Discharge status: Home / Self Care | Payer: Medicare Other | Source: Ambulatory Visit | Attending: Emergency Medicine | Admitting: Emergency Medicine

## 2010-06-20 DIAGNOSIS — J029 Acute pharyngitis, unspecified: Secondary | ICD-10-CM

## 2010-06-20 LAB — POCT RAPID STREP A (OFFICE): Streptococcus, Group A Screen (Direct): NEGATIVE

## 2010-06-23 ENCOUNTER — Telehealth: Payer: Self-pay | Admitting: *Deleted

## 2010-06-23 NOTE — Telephone Encounter (Signed)
[facility] Triage Call Report Triage Record Num: [id number] Operator: April Finney Patient Name: Linda Mays Call Date & Time: 06/20/2010 6:03:13PM Patient Phone: [phone] PCP: Tera Mater. Clent Ridges Patient Gender: Female PCP Fax : [phone] Patient DOB: 1941/04/26 Practice Name: [facility] Reason for Call: Harriett Sine calling about headache, sore throat, and fever. She started Lisinopril yesterday and is not sure if this is side effects of the medication or a cold. No thermometer available but states she has a fever and is flushed. No emergent symptoms. See in 24 hrs care advice given. Going to [employer]. Protocol(s) Used: Headache Recommended Outcome per Protocol: See Provider within 24 hours Reason for Outcome: Headache associated with or made worse by coughing, straining at stool, exertion or sexual activity Care Advice: ~ Do not drink alcoholic beverages or use tobacco products. ~ Call EMS 911 if headache is "worst headache of my life." ~ If diagnosed with hypertension, do not take aspirin for headache. ~ SYMPTOM / CONDITION MANAGEMENT ~ EXPECTED COURSE ~ CAUTIONS ~ These headaches usually last minutes to hours and improve without treatment. Call provider for evaluation if headaches do not improve without treatment or occur frequently. Even if these headaches eventually go away, you need to be evaluated for a more serious cause. ~ ~ Call provider immediately for severe headache that lasts more than 24 hours. ~ Avoid exertional activity while having a headache until evaluated. Most adults need to drink 6-10 eight-ounce glasses (1.2-2.0 liters) of fluids per day unless previously told to limit fluid intake for other medical reasons. Limit fluids that contain caffeine, sugar or alcohol. Urine will be a very light yellow color when you drink enough fluids. ~ Analgesic/Antipyretic Advice - Acetaminophen: Consider acetaminophen as directed on label or by  pharmacist/provider for pain or fever PRECAUTIONS: - Use if there is no history of liver disease, alcoholism, or intake of three or more alcohol drinks per day - Only if approved by provider during pregnancy or when breastfeeding - During pregnancy, acetaminophen should not be taken more than 3 consecutive days without telling provider - Do not exceed recommended dose or frequency ~ Analgesic/Antipyretic Advice - NSAIDs: Consider aspirin, ibuprofen, naproxen or ketoprofen for pain or fever as directed on label or by pharmacist/provider. PRECAUTIONS: - If over 66 years of age, should not take longer than 1 week without consulting provider. EXCEPTIONS: - Should not be used if taking blood thinners or have bleeding problems. - Do not use if have history of sensitivity/allergy to any of these medications; or history of cardiovascular, ulcer, kidney, liver disease or diabetes unless approved by provider. - Do not exceed recommended dose or frequency. ~ 06/20/2010 6:26:27PM Page 1 of 2 CAN_TriageRpt_V2 [facility] Triage Call Report Patient Name: Eda Magnussen continuation page/s 06/20/2010 6:26:27PM Page 2 of 2 CAN_TriageRpt_V2

## 2010-06-25 ENCOUNTER — Ambulatory Visit (INDEPENDENT_AMBULATORY_CARE_PROVIDER_SITE_OTHER): Payer: Medicare Other | Admitting: Family Medicine

## 2010-06-25 ENCOUNTER — Encounter: Payer: Self-pay | Admitting: Family Medicine

## 2010-06-25 DIAGNOSIS — T783XXA Angioneurotic edema, initial encounter: Secondary | ICD-10-CM

## 2010-06-25 DIAGNOSIS — I10 Essential (primary) hypertension: Secondary | ICD-10-CM

## 2010-06-25 MED ORDER — METOPROLOL SUCCINATE ER 100 MG PO TB24: 100.0000 mg | ORAL_TABLET | Freq: Every day | ORAL | Status: DC

## 2010-06-25 NOTE — Progress Notes (Signed)
  Subjective:    Patient ID: Linda Mays, female    DOB: 19-Sep-1940, 70 y.o.   MRN: [id number]  HPI Here to follow up on HTN and on medication reactions. She had been on Tenormin for years and had done well. However since this has been unavailable in pharmacies, she was recently switched to Lisinopril. After taking just 2 doses of this, she woke up one morning last week with swelling, redness, and tingling around the ears and face ane neck. No SOB. She stopped taking Lisinopril and within 24 hours these went away. She then doubled up on her Metoprolol, and she has felt fine this week.    Review of Systems  Constitutional: Negative.   HENT: Negative.   Eyes: Negative.   Respiratory: Negative.   Cardiovascular: Negative.        Objective:   Physical Exam  Constitutional: She appears well-developed and well-nourished.  HENT:  Head: Normocephalic and atraumatic.  Right Ear: External ear normal.  Left Ear: External ear normal.  Nose: Nose normal.  Mouth/Throat: Oropharynx is clear and moist. No oropharyngeal exudate.  Neck: Normal range of motion.  Cardiovascular: Normal rate, regular rhythm, normal heart sounds and intact distal pulses.   Pulmonary/Chest: Effort normal and breath sounds normal.  Lymphadenopathy:    She has no cervical adenopathy.          Assessment & Plan:  We will stay away from ACE inhibitors. Stay on the higher dose of Toprol.

## 2010-07-03 ENCOUNTER — Telehealth: Payer: Self-pay | Admitting: Family Medicine

## 2010-07-03 NOTE — Telephone Encounter (Signed)
Pt would like to go back on lotensin-hct 20-12.5mg  #30  please call [facility] [phone] this will be new rx . Pt will take metoprolol 50 mg instead on 100mg 

## 2010-07-04 NOTE — Telephone Encounter (Signed)
No, she had an angioedema reaction to ACE inhibitors, so we cannot use Lotensin. She was doing well on Metoproplol so I would like to keep her on this. If she wants to change,  Have her make an OV to discuss this

## 2010-07-04 NOTE — Telephone Encounter (Signed)
Triage vm------checking on fax for Lotensin HCT at [facility]---ph---[phone].

## 2010-07-07 NOTE — Telephone Encounter (Signed)
Spoke with pt and will sch appt as requested.

## 2010-07-15 ENCOUNTER — Other Ambulatory Visit: Payer: Self-pay | Admitting: Family Medicine

## 2010-09-03 ENCOUNTER — Ambulatory Visit (INDEPENDENT_AMBULATORY_CARE_PROVIDER_SITE_OTHER): Payer: Medicare Other | Admitting: Family Medicine

## 2010-09-03 ENCOUNTER — Encounter: Payer: Self-pay | Admitting: Family Medicine

## 2010-09-03 VITALS — BP 132/90 | HR 60 | Temp 98.1°F | Wt 115.0 lb

## 2010-09-03 DIAGNOSIS — L600 Ingrowing nail: Secondary | ICD-10-CM

## 2010-09-03 DIAGNOSIS — IMO0002 Reserved for concepts with insufficient information to code with codable children: Secondary | ICD-10-CM

## 2010-09-03 MED ORDER — LEVOFLOXACIN 500 MG PO TABS: 500.0000 mg | ORAL_TABLET | Freq: Every day | ORAL | Status: AC

## 2010-09-03 NOTE — Progress Notes (Signed)
  Subjective:    Patient ID: Linda Mays, female    DOB: 26-Jun-1940, 70 y.o.   MRN: [id number]  HPI Here for one week of a painful left great toe. She has had enlarged nails for years. No recent trauma.   Review of Systems  Constitutional: Negative.   Skin: Positive for color change.       Objective:   Physical Exam  Constitutional: She appears well-developed and well-nourished.  Musculoskeletal:       Both great toenails are markedly thickened and curled. The left great toe is tender at the base of the nail.           Assessment & Plan:  Use Levaquin and arm soaks. See Podiatry for removals of the nails.

## 2010-09-04 ENCOUNTER — Telehealth: Payer: Self-pay | Admitting: *Deleted

## 2010-09-04 NOTE — Telephone Encounter (Signed)
Yes this is the standard dose we give everyone

## 2010-09-04 NOTE — Telephone Encounter (Signed)
Notified pt. 

## 2010-09-04 NOTE — Telephone Encounter (Signed)
Pt is calling concerned that the Levaquin 500 mg may be too strong for her??  Does Dr. Clent Ridges really want her to take an antibiotic this strong???

## 2010-09-09 NOTE — Assessment & Plan Note (Signed)
[facility]                            CARDIOLOGY OFFICE NOTE   LUIS, Linda                        MRN:          [id number]  DATE:07/17/2008                            DOB:          [birth year]    I was asked by Dr. Gershon Crane to consult on Linda Mays with the chief  complaint of palpitations.   HISTORY OF PRESENT ILLNESS:  She is a 70 year old divorced white female,  daughter of a patient that I used to take care Mr. Linda Mays, who  comes today for the above concerns.   Review of her outside records from Dr. Clent Ridges showed that this was captured  as ventricular bigeminy on EKG dated June 14, 2008.   Dr. Clent Ridges placed her on metoprolol 25 mg p.o. sustained release a day.  This has remarkably improved these, in fact she does not even  have them  now.   She has no dyspnea on exertion.  No anginal equivalents or ischemic  equivalents.  She is quite active.  She denies any syncope or  presyncope.  She has had no orthopnea or PND.   PAST MEDICAL HISTORY:  1. Hypertension.  2. Osteoporosis.  3. Mildly elevated cholesterol.  4. Hypothyroidism.  5. Gastroesophageal reflux.  6. History of colonic polyps.   PAST SURGICAL HISTORY:  Hysterectomy, oophorectomy, lumpectomy of both  breasts, bladder tack procedure, colonoscopy in 2000, which was normal.   CURRENT MEDICATIONS:  1. Centrum multivitamin.  2. Lotensin HCT 20/12.5 daily.  3. Synthroid 50 mcg per day.  4. Vitamin E 600 units a day.  5. Calcium carbonate and vitamin D 600/400 mg unit tablet.  6. Metoprolol succinate 25 mg daily.  7. Simvastatin 40 mg daily.   She has intolerances to PENICILLIN, SULFA, DOXYCYCLINE, MONO-GESIC,  __________ Scarlette Shorts COUGH SYRUP.   SOCIAL HISTORY:  She is divorced.  She has 2 children.  She is under a  lot of stress, currently taking care of mother who has Alzheimer's.  Apparently, she is not doing well.  She has never smoked.  She does not  drink  alcohol.  There is obviously no illicit drug use.   FAMILY HISTORY:  Remarkable for __________ father before the age of 37.  There is a family history of hypertension and diabetes.   REVIEW OF SYSTEMS:  Other than the HPI, all systems are negative.  Please refer to Dr. Claris Che note and EMR as well for review of systems on  July 03, 2008.   PHYSICAL EXAMINATION:  VITAL SIGNS:  Her blood pressure is 120/80, her  pulse is 68 and regular.  She is 5 feet 2 inches, weighs 112 pounds.  HEENT:  Essentially normal.  NECK:  Supple.  Carotid upstrokes are equal bilaterally without bruits.  No JVD.  Thyroid is not enlarged.  Trachea is midline.  LUNGS:  Clear to auscultation and percussion.  HEART:  Regular rate and rhythm.  No murmur, rub, or gallop.  ABDOMEN:  Soft and good bowel sounds.  No midline or pulsatile mass.  EXTREMITIES:  No cyanosis, clubbing, or edema.  Pulses are intact.  NEUROLOGIC:  Intact.  MUSCULOSKELETAL:  Chronic arthritic changes.  SKIN:  Pale and dry.   EKG is completely normal today.   ASSESSMENT:  1. Symptomatic premature ventricular contractions, now resolved on low-      dose metoprolol.  There is no symptoms to suggest any significant      obstructive coronary artery disease.  She does have multiple risk      factors, however.  2. Hypertension, under good control.  3. Hyperlipidemia, on a statin.  4. Hypothyroidism, on Synthroid.  Euthyroid by most recent labs.      Please refer to EMR.   I have basically just reassured [name] that she is doing well.  I  think Dr. Clent Ridges is taking care of the problem with low-dose metoprolol.  If she begins to have any recurrent problems or any symptoms consistent  with potential coronary artery disease, she will let me know.  Otherwise, I will see her back on a p.r.n. basis.     Thomas C. Daleen Squibb, MD, [facility]  Electronically Signed    TCW/MedQ  DD: 07/17/2008  DT: 07/18/2008  Job #: [id number]

## 2010-09-19 ENCOUNTER — Other Ambulatory Visit: Payer: Self-pay | Admitting: *Deleted

## 2010-09-19 DIAGNOSIS — E039 Hypothyroidism, unspecified: Secondary | ICD-10-CM

## 2010-09-19 MED ORDER — SYNTHROID 50 MCG PO TABS: 50.0000 ug | ORAL_TABLET | Freq: Every day | ORAL | Status: DC

## 2011-01-24 ENCOUNTER — Other Ambulatory Visit: Payer: Self-pay | Admitting: Family Medicine

## 2011-01-27 NOTE — Telephone Encounter (Signed)
Script sent e-scribe 

## 2011-03-25 ENCOUNTER — Telehealth: Payer: Self-pay | Admitting: Family Medicine

## 2011-03-25 NOTE — Telephone Encounter (Signed)
Pt said she is having symptoms of high blood pressure, pt has not actually checked her blood pressure but said she can tell it is elevated. Pt does take meds for her blood pressure and took it this morning and said it helped a little bit  But is concerned she is scheduled for an appt tomorrow but would like to be seen today

## 2011-03-26 ENCOUNTER — Ambulatory Visit (INDEPENDENT_AMBULATORY_CARE_PROVIDER_SITE_OTHER): Payer: Medicare Other | Admitting: Family Medicine

## 2011-03-26 ENCOUNTER — Encounter: Payer: Self-pay | Admitting: Family Medicine

## 2011-03-26 VITALS — BP 142/88 | HR 72 | Temp 98.5°F | Wt 117.0 lb

## 2011-03-26 DIAGNOSIS — Z23 Encounter for immunization: Secondary | ICD-10-CM

## 2011-03-26 DIAGNOSIS — I10 Essential (primary) hypertension: Secondary | ICD-10-CM

## 2011-03-26 MED ORDER — METOPROLOL SUCCINATE ER 100 MG PO TB24: 150.0000 mg | ORAL_TABLET | Freq: Every day | ORAL | Status: DC

## 2011-03-26 NOTE — Telephone Encounter (Signed)
We will see her at the appt

## 2011-03-26 NOTE — Progress Notes (Signed)
  Subjective:    Patient ID: Linda Mays, female    DOB: 1940/08/30, 70 y.o.   MRN: [id number]  HPI Here with concerns about her BP. At our last visit in May her BP was borderline high. Then yesterday morning she woke up with a HA and a pressure sensation in her head. She says that she always feels this way when her BP is up. She did not take her BP at home. Today she feels totally back to normal. No chest pain or SOB. She admits to eating a lot of salty foods over the Thanksgiving holiday including cured ham.    Review of Systems  Constitutional: Negative.   Respiratory: Negative.   Cardiovascular: Negative.   Neurological: Positive for headaches. Negative for dizziness and light-headedness.       Objective:   Physical Exam  Constitutional: She appears well-developed and well-nourished.  Eyes: Conjunctivae are normal. Pupils are equal, round, and reactive to light.  Neck: No thyromegaly present.  Cardiovascular: Normal rate, regular rhythm, normal heart sounds and intact distal pulses.   Pulmonary/Chest: Effort normal and breath sounds normal.  Lymphadenopathy:    She has no cervical adenopathy.          Assessment & Plan:  Her BP has probably been inching up over the past year, and then it probably spiked yesterday after she consumed a lot of sodium. We will increase her daily dose of metoprolol to 150 mg. Recheck in one month.

## 2011-03-26 NOTE — Progress Notes (Signed)
Addended by: Aniceto Boss A on: 03/26/2011 12:04 PM   Modules accepted: Orders

## 2011-07-03 DIAGNOSIS — H2510 Age-related nuclear cataract, unspecified eye: Secondary | ICD-10-CM | POA: Diagnosis not present

## 2011-07-03 DIAGNOSIS — H35039 Hypertensive retinopathy, unspecified eye: Secondary | ICD-10-CM | POA: Diagnosis not present

## 2011-07-03 DIAGNOSIS — H04129 Dry eye syndrome of unspecified lacrimal gland: Secondary | ICD-10-CM | POA: Diagnosis not present

## 2011-07-03 DIAGNOSIS — H43819 Vitreous degeneration, unspecified eye: Secondary | ICD-10-CM | POA: Diagnosis not present

## 2011-09-12 ENCOUNTER — Other Ambulatory Visit: Payer: Self-pay | Admitting: Family Medicine

## 2011-11-17 ENCOUNTER — Encounter: Payer: Self-pay | Admitting: Family Medicine

## 2011-11-17 ENCOUNTER — Ambulatory Visit (INDEPENDENT_AMBULATORY_CARE_PROVIDER_SITE_OTHER): Payer: Medicare Other | Admitting: Family Medicine

## 2011-11-17 VITALS — BP 140/86 | HR 66 | Temp 98.3°F | Wt 114.0 lb

## 2011-11-17 DIAGNOSIS — I10 Essential (primary) hypertension: Secondary | ICD-10-CM

## 2011-11-17 MED ORDER — METOPROLOL SUCCINATE ER 100 MG PO TB24: 100.0000 mg | ORAL_TABLET | Freq: Two times a day (BID) | ORAL | Status: DC

## 2011-11-17 NOTE — Progress Notes (Signed)
  Subjective:    Patient ID: Linda Mays, female    DOB: [birth year], 71 y.o.   MRN: [id number]  HPI Here for elevated BP. She has not checked her BP at home even though she has an automatic cuff. She has felt her heart pounding at times and she gets mild HAs at times, and she is certain the BP has been high. She has increased her Metoprolol to 1 and 1/2 tabs each morning (total of 150 mg). This helps her feel good throughout the day, but during the night she feels like the BP starts creeping back up again. No SOB or chest pain.    Review of Systems  Constitutional: Negative.   Respiratory: Negative.   Cardiovascular: Negative.        Objective:   Physical Exam  Constitutional: She appears well-developed and well-nourished.  Cardiovascular: Normal rate, regular rhythm, normal heart sounds and intact distal pulses.   Pulmonary/Chest: Effort normal and breath sounds normal.          Assessment & Plan:  Her BP does seem to be going up a bit. Increase the Metoprolol to 100 mg bid to give better 24 hour control. She will check her BP at home 2-3 times a day and follow up in 3 weeks

## 2012-03-01 ENCOUNTER — Ambulatory Visit (INDEPENDENT_AMBULATORY_CARE_PROVIDER_SITE_OTHER): Payer: Medicare Other

## 2012-03-01 DIAGNOSIS — Z23 Encounter for immunization: Secondary | ICD-10-CM

## 2012-03-15 DIAGNOSIS — Z1151 Encounter for screening for human papillomavirus (HPV): Secondary | ICD-10-CM | POA: Diagnosis not present

## 2012-03-15 DIAGNOSIS — Z01419 Encounter for gynecological examination (general) (routine) without abnormal findings: Secondary | ICD-10-CM | POA: Diagnosis not present

## 2012-03-15 DIAGNOSIS — N3943 Post-void dribbling: Secondary | ICD-10-CM | POA: Diagnosis not present

## 2012-03-15 DIAGNOSIS — Z124 Encounter for screening for malignant neoplasm of cervix: Secondary | ICD-10-CM | POA: Diagnosis not present

## 2012-03-15 DIAGNOSIS — R32 Unspecified urinary incontinence: Secondary | ICD-10-CM | POA: Diagnosis not present

## 2012-03-17 DIAGNOSIS — Z1231 Encounter for screening mammogram for malignant neoplasm of breast: Secondary | ICD-10-CM | POA: Diagnosis not present

## 2012-04-01 ENCOUNTER — Encounter: Payer: Self-pay | Admitting: Family Medicine

## 2012-04-01 ENCOUNTER — Telehealth: Payer: Self-pay | Admitting: Internal Medicine

## 2012-04-01 ENCOUNTER — Ambulatory Visit (INDEPENDENT_AMBULATORY_CARE_PROVIDER_SITE_OTHER): Payer: Medicare Other | Admitting: Family Medicine

## 2012-04-01 VITALS — BP 122/80 | HR 67 | Temp 98.3°F | Ht 61.25 in | Wt 117.0 lb

## 2012-04-01 DIAGNOSIS — I499 Cardiac arrhythmia, unspecified: Secondary | ICD-10-CM

## 2012-04-01 DIAGNOSIS — I10 Essential (primary) hypertension: Secondary | ICD-10-CM

## 2012-04-01 DIAGNOSIS — D869 Sarcoidosis, unspecified: Secondary | ICD-10-CM | POA: Diagnosis not present

## 2012-04-01 DIAGNOSIS — K219 Gastro-esophageal reflux disease without esophagitis: Secondary | ICD-10-CM

## 2012-04-01 DIAGNOSIS — E039 Hypothyroidism, unspecified: Secondary | ICD-10-CM

## 2012-04-01 DIAGNOSIS — Z86010 Personal history of colonic polyps: Secondary | ICD-10-CM

## 2012-04-01 LAB — BASIC METABOLIC PANEL
BUN: 21 mg/dL (ref 6–23)
CO2: 29 mEq/L (ref 19–32)
Calcium: 9.8 mg/dL (ref 8.4–10.5)
Chloride: 102 mEq/L (ref 96–112)
Creatinine, Ser: 0.9 mg/dL (ref 0.4–1.2)
GFR: 65.16 mL/min (ref >60.00)
Glucose, Bld: 89 mg/dL (ref 70–99)
Potassium: 4.5 mEq/L (ref 3.5–5.1)
Sodium: 140 mEq/L (ref 135–145)

## 2012-04-01 LAB — CBC WITH DIFFERENTIAL/PLATELET
Basophils Absolute: 0 10*3/uL (ref 0.0–0.1)
Basophils Relative: 0.2 % (ref 0.0–3.0)
Eosinophils Absolute: 0.2 10*3/uL (ref 0.0–0.7)
Eosinophils Relative: 3.2 % (ref 0.0–5.0)
HCT: 43.6 % (ref 36.0–46.0)
Hemoglobin: 14.4 g/dL (ref 12.0–15.0)
Lymphocytes Relative: 42.2 % (ref 12.0–46.0)
Lymphs Abs: 2.4 10*3/uL (ref 0.7–4.0)
MCHC: 33.2 g/dL (ref 30.0–36.0)
MCV: 93 fl (ref 78.0–100.0)
Monocytes Absolute: 0.7 10*3/uL (ref 0.1–1.0)
Monocytes Relative: 11.9 % (ref 3.0–12.0)
Neutro Abs: 2.4 10*3/uL (ref 1.4–7.7)
Neutrophils Relative %: 42.5 % — ABNORMAL LOW (ref 43.0–77.0)
Platelets: 207 10*3/uL (ref 150.0–400.0)
RBC: 4.68 Mil/uL (ref 3.87–5.11)
RDW: 11.8 % (ref 11.5–14.6)
WBC: 5.7 10*3/uL (ref 4.5–10.5)

## 2012-04-01 LAB — HEPATIC FUNCTION PANEL
ALT: 24 U/L (ref 0–35)
AST: 23 U/L (ref 0–37)
Albumin: 4.4 g/dL (ref 3.5–5.2)
Alkaline Phosphatase: 76 U/L (ref 39–117)
Bilirubin, Direct: 0.1 mg/dL (ref 0.0–0.3)
Total Bilirubin: 1.1 mg/dL (ref 0.3–1.2)
Total Protein: 7.5 g/dL (ref 6.0–8.3)

## 2012-04-01 LAB — LIPID PANEL
Cholesterol: 236 mg/dL — ABNORMAL HIGH (ref 0–200)
HDL: 45.2 mg/dL (ref >39.00)
Total CHOL/HDL Ratio: 5
Triglycerides: 170 mg/dL — ABNORMAL HIGH (ref 0.0–149.0)
VLDL: 34 mg/dL (ref 0.0–40.0)

## 2012-04-01 LAB — POCT URINALYSIS DIPSTICK
Bilirubin, UA: NEGATIVE
Blood, UA: NEGATIVE
Glucose, UA: NEGATIVE
Ketones, UA: NEGATIVE
Nitrite, UA: NEGATIVE
Protein, UA: NEGATIVE
Spec Grav, UA: 1.025
Urobilinogen, UA: 0.2
pH, UA: 6

## 2012-04-01 LAB — TSH: TSH: 1.76 u[IU]/mL (ref 0.35–5.50)

## 2012-04-01 LAB — LDL CHOLESTEROL, DIRECT: Direct LDL: 168.9 mg/dL

## 2012-04-01 NOTE — Progress Notes (Signed)
  Subjective:    Patient ID: Linda Mays, female    DOB: 1940/05/10, 71 y.o.   MRN: [id number]  HPI 71 yr old female for a cpx. She feels fine and has no concerns. She is very overdue for a colonoscopy, but she has a hx of intolerance to the bowel prep. She recently saw Linda Mays for a GYN exam and they discovered some microscopic blood in her urine. They have set her up to see Urology on 04-12-12 to work this up. She has no urinary symptoms.    Review of Systems  Constitutional: Negative.   HENT: Negative.   Eyes: Negative.   Respiratory: Negative.   Cardiovascular: Negative.   Gastrointestinal: Negative.   Genitourinary: Negative for dysuria, urgency, frequency, hematuria, flank pain, decreased urine volume, enuresis, difficulty urinating, pelvic pain and dyspareunia.  Musculoskeletal: Negative.   Skin: Negative.   Neurological: Negative.   Hematological: Negative.   Psychiatric/Behavioral: Negative.        Objective:   Physical Exam  Constitutional: She is oriented to person, place, and time. She appears well-developed and well-nourished. No distress.  HENT:  Head: Normocephalic and atraumatic.  Right Ear: External ear normal.  Left Ear: External ear normal.  Nose: Nose normal.  Mouth/Throat: Oropharynx is clear and moist. No oropharyngeal exudate.  Eyes: Conjunctivae normal and EOM are normal. Pupils are equal, round, and reactive to light. No scleral icterus.  Neck: Normal range of motion. Neck supple. No JVD present. No thyromegaly present.  Cardiovascular: Normal rate, regular rhythm, normal heart sounds and intact distal pulses.  Exam reveals no gallop and no friction rub.   No murmur heard. Pulmonary/Chest: Effort normal and breath sounds normal. No respiratory distress. She has no wheezes. She has no rales. She exhibits no tenderness.  Abdominal: Soft. Bowel sounds are normal. She exhibits no distension and no mass. There is no tenderness. There is no rebound and no  guarding.  Musculoskeletal: Normal range of motion. She exhibits no edema and no tenderness.  Lymphadenopathy:    She has no cervical adenopathy.  Neurological: She is alert and oriented to person, place, and time. She has normal reflexes. No cranial nerve deficit. She exhibits normal muscle tone. Coordination normal.  Skin: Skin is warm and dry. No rash noted. No erythema.  Psychiatric: She has a normal mood and affect. Her behavior is normal. Judgment and thought content normal.          Assessment & Plan:  Well exam. Get fasting labs. Set up a visit with Linda Mays to discuss the best way to evaluate her colon.

## 2012-04-01 NOTE — Telephone Encounter (Signed)
error 

## 2012-04-04 NOTE — Telephone Encounter (Signed)
Pts PCP requested pt see Dr. Marina Goodell to discuss prep options and schedule a colon. Pt states she had problems with prep years ago. Pt scheduled to see Dr. Marina Goodell 05/02/12@10 :15am. Pt aware of appt date and time.

## 2012-04-04 NOTE — Telephone Encounter (Signed)
Per old records patient was seen by Dr. Marina Goodell on 10/09/1998, Dr. Juanda Chance in ED on 10/18/1998 and as a new patient by Dr. Marina Goodell on 12/15/2001.

## 2012-04-06 ENCOUNTER — Encounter: Payer: Self-pay | Admitting: Family Medicine

## 2012-04-06 NOTE — Progress Notes (Signed)
Quick Note:  I spoke with pt ______ 

## 2012-04-12 DIAGNOSIS — R3129 Other microscopic hematuria: Secondary | ICD-10-CM | POA: Diagnosis not present

## 2012-04-12 DIAGNOSIS — R339 Retention of urine, unspecified: Secondary | ICD-10-CM | POA: Diagnosis not present

## 2012-04-14 DIAGNOSIS — R3129 Other microscopic hematuria: Secondary | ICD-10-CM | POA: Diagnosis not present

## 2012-04-14 DIAGNOSIS — R39198 Other difficulties with micturition: Secondary | ICD-10-CM | POA: Diagnosis not present

## 2012-04-26 ENCOUNTER — Ambulatory Visit (INDEPENDENT_AMBULATORY_CARE_PROVIDER_SITE_OTHER): Payer: Medicare Other | Admitting: Family Medicine

## 2012-04-26 ENCOUNTER — Encounter: Payer: Self-pay | Admitting: Family Medicine

## 2012-04-26 VITALS — BP 148/90 | HR 70 | Temp 98.2°F | Wt 121.0 lb

## 2012-04-26 DIAGNOSIS — H6120 Impacted cerumen, unspecified ear: Secondary | ICD-10-CM | POA: Diagnosis not present

## 2012-04-26 DIAGNOSIS — I10 Essential (primary) hypertension: Secondary | ICD-10-CM

## 2012-04-26 NOTE — Progress Notes (Addendum)
Chief Complaint  Patient presents with  . ears stopped up    HPI:  Acute visit for "Ears Stopped Up with ear wax" -has had this before and cleaned out in office -want clean out with water today -denies pain, fevers, nasal symptoms  HTN: -BP elevated today -reports has been fine at home   ROS: See pertinent positives and negatives per HPI.  Past Medical History  Diagnosis Date  . Osteoporosis   . Hypertension   . Hypothyroidism   . GERD (gastroesophageal reflux disease)   . History of colonic polyps   . Routine gynecological examination     sees Dr. Seymour Bars     Family History  Problem Relation Age of Onset  . Colon cancer Other     1st degree relative<60  . Coronary artery disease Other     female 1st degree relative<50  . Diabetes Other     1st degree relative  . Hypertension Other   . Prostate cancer Other     1st degree relative <50  . Diverticulitis Mother     History   Social History  . Marital Status: Divorced    Spouse Name: N/A    Number of Children: N/A  . Years of Education: N/A   Social History Main Topics  . Smoking status: Never Smoker   . Smokeless tobacco: Never Used  . Alcohol Use: No  . Drug Use: No  . Sexually Active: None   Other Topics Concern  . None   Social History Narrative  . None    Current outpatient prescriptions:Calcium Carbonate-Vit D-Min 600-400 MG-UNIT TABS, Take 1 tablet by mouth 2 (two) times daily. , Disp: , Rfl: ;  clotrimazole (LOTRIMIN) 1 % cream, Apply 1 application topically as needed. For dry skin, Disp: , Rfl: ;  metoprolol succinate (TOPROL XL) 100 MG 24 hr tablet, Take 1 tablet (100 mg total) by mouth 2 (two) times daily., Disp: 60 tablet, Rfl: 11 Multiple Vitamin (MULTIVITAMINS PO), Take 1 tablet by mouth daily.  , Disp: , Rfl: ;  SYNTHROID 50 MCG tablet, TAKE 1 TABLET EVERY DAY, Disp: 30 tablet, Rfl: 6;  vitamin E 600 UNIT capsule, Take 400 Units by mouth daily. , Disp: , Rfl:   EXAM:  Filed Vitals:   04/26/12 1505  BP: 148/90  Pulse: 70  Temp: 98.2 F (36.8 C)    There is no height on file to calculate BMI.  GENERAL: vitals reviewed and listed above, alert, oriented, appears well hydrated and in no acute distress  HEENT: atraumatic, conjunttiva clear, no obvious abnormalities on inspection of external nose and ears, soft ear wax partially occluding on L, soft occlusive wax on L  MS: moves all extremities without noticeable abnormality  PSYCH: pleasant and cooperative, no obvious depression or anxiety  ASSESSMENT AND PLAN:  Discussed the following assessment and plan:  1. Cerumen impaction   2. HYPERTENSION    -cerumen removal L ear -BP elevated a little today, advised home log of checks a few times per week at random times and follow up with PCP to review log and recheck -Patient advised to return or notify a doctor immediately if symptoms worsen or persist or new concerns arise.  Patient Instructions  -no q-tips in ears  -can use Debrox or over the counter ear cleaning drops as needed  -follow up with your doctor if new or worsening symptoms     Linda Mays R.

## 2012-04-26 NOTE — Addendum Note (Signed)
Addended by: Terressa Koyanagi on: 04/26/2012 05:29 PM   Modules accepted: Level of Service

## 2012-04-26 NOTE — Patient Instructions (Signed)
-  no q-tips in ears  -can use Debrox or over the counter ear cleaning drops as needed  -follow up with your doctor if new or worsening symptoms

## 2012-05-02 ENCOUNTER — Ambulatory Visit (INDEPENDENT_AMBULATORY_CARE_PROVIDER_SITE_OTHER): Payer: Medicare Other | Admitting: Internal Medicine

## 2012-05-02 ENCOUNTER — Other Ambulatory Visit: Payer: Self-pay

## 2012-05-02 ENCOUNTER — Encounter: Payer: Self-pay | Admitting: Internal Medicine

## 2012-05-02 VITALS — BP 162/84 | HR 69 | Ht 61.0 in | Wt 118.0 lb

## 2012-05-02 DIAGNOSIS — Z860100 Personal history of colon polyps, unspecified: Secondary | ICD-10-CM

## 2012-05-02 DIAGNOSIS — R198 Other specified symptoms and signs involving the digestive system and abdomen: Secondary | ICD-10-CM | POA: Diagnosis not present

## 2012-05-02 DIAGNOSIS — Z800 Family history of malignant neoplasm of digestive organs: Secondary | ICD-10-CM

## 2012-05-02 MED ORDER — MOVIPREP 100 G PO SOLR: 1.0000 | Freq: Once | ORAL | Status: DC

## 2012-05-02 MED ORDER — METOCLOPRAMIDE HCL 10 MG PO TABS: ORAL_TABLET | ORAL | Status: DC

## 2012-05-02 NOTE — Patient Instructions (Addendum)
You have been scheduled for a colonoscopy with propofol. Please follow written instructions given to you at your visit today.  Please pick up your prep kit at the pharmacy within the next 1-3 days. If you use inhalers (even only as needed) or a CPAP machine, please bring them with you on the day of your procedure.  We have sent the following medications to your pharmacy for you to pick up at your convenience:  Reglan  Use hard candy when drinking the prep, as discussed with Dr. Marina Goodell

## 2012-05-02 NOTE — Progress Notes (Signed)
HISTORY OF PRESENT ILLNESS:  Linda Mays is a 72 y.o. female with the below listed medical history who presents today regarding surveillance colonoscopy and change in bowel habits. She reports 2 month history of intermittent mucus per rectum. Generally associated with gas and after a regular bowel movement. No bleeding or change in bowel consistency or frequency. No abnormal pain. Her index colonoscopy was elsewhere in the early 1990s, where she reported "having had a polyp". I performed colonoscopy in April of 1997. Examination was entirely normal with normal preparation. There is a family history of colon cancer in her father at greater than age 83. She was seen in August of 2003 regarding change in bowel habits and surveillance colonoscopy. Apparently, reported difficulty consuming large-volume preps and requested/received "pills", which I assume was Visicol. She cannot specify, but did not tolerate this preparation and required an emergency room visit with what sounds like rehydration. Did not proceed with colonoscopy and has not been seen since. Her chronic medical problems are stable. She has a number of concerns regarding her prep and blood pressure. Review of outside laboratories from December 2013 reveal unremarkable CBC and comprehensive metabolic panel as well as TSH. Elevated lipids.  REVIEW OF SYSTEMS:  All non-GI ROS negative per patient except arthritis  Past Medical History  Diagnosis Date  . Osteoporosis   . Hypertension   . Hypothyroidism   . GERD (gastroesophageal reflux disease)   . History of colonic polyps   . Routine gynecological examination     sees Dr. Seymour Bars   . Osteoarthritis     Past Surgical History  Procedure Date  . Abdominal hysterectomy   . Oophorectomy   . Breast lumpectomy   . Bladder tack procedure     Social History Linda Mays  reports that she has never smoked. She has never used smokeless tobacco. She reports that she does not drink alcohol or  use illicit drugs.  family history includes Colon cancer in her other; Coronary artery disease in her other; Diabetes in her other; Diverticulitis in her mother; Hypertension in her other; and Prostate cancer in her other.  Allergies  Allergen Reactions  . Lisinopril-Hydrochlorothiazide Other (See Comments)    Hot flashes  . Ace Inhibitors     angioedema  . Doxycycline   . Iophen C-Nr (Guaifenesin-Codeine)   . Penicillins   . Sulfonamide Derivatives        PHYSICAL EXAMINATION: Vital signs: BP 162/84  Pulse 69  Ht 5\' 1"  (1.549 m)  Wt 118 lb (53.524 kg)  BMI 22.30 kg/m2  Constitutional: generally well-appearing, no acute distress Psychiatric: alert and oriented x3, cooperative. Anxious Eyes: extraocular movements intact, anicteric, conjunctiva pink Mouth: oral pharynx moist, no lesions Neck: supple no lymphadenopathy Cardiovascular: heart regular rate and rhythm, no murmur Lungs: clear to auscultation bilaterally Abdomen: soft, nontender, nondistended, no obvious ascites, no peritoneal signs, normal bowel sounds, no organomegaly Rectal: deferred until colonoscopy Extremities: no lower extremity edema bilaterally Skin: no lesions on visible extremities Neuro: No focal deficits.    ASSESSMENT:  #1. Family history of colon cancer in her father greater than a 57. Last colonoscopy 1997 was normal. Question of "polyps" on colonoscopy elsewhere prior to 1997. No records. Requests surveillance colonoscopy. #2. Nonspecific change in bowel habits as manifested by mucus   PLAN:  #1. Colonoscopy.The nature of the procedure, as well as the risks, benefits, and alternatives were carefully and thoroughly reviewed with the patient. Ample time for discussion and questions allowed. The  patient understood, was satisfied, and agreed to proceed.  #2. Movi prep. Patient instructed on its use #3. Prescribe metoclopramide 10 mg to be given 20 minutes prior to each prep session. Hopefully this  will help with nausea related issues #4. Hard candy with prep. Discussed #5. The blood pressure medications as she would normally

## 2012-05-04 ENCOUNTER — Other Ambulatory Visit: Payer: Self-pay | Admitting: Family Medicine

## 2012-05-04 NOTE — Telephone Encounter (Signed)
Pt needs new rx synthoid 50 mcg with refills call into [facility] in [place] [phone]. This is pt new pharm

## 2012-05-05 MED ORDER — SYNTHROID 50 MCG PO TABS: 50.0000 ug | ORAL_TABLET | Freq: Every day | ORAL | Status: DC

## 2012-05-05 NOTE — Telephone Encounter (Signed)
I sent script e-scribe. 

## 2012-05-27 ENCOUNTER — Encounter: Payer: Self-pay | Admitting: Family Medicine

## 2012-05-27 ENCOUNTER — Ambulatory Visit (INDEPENDENT_AMBULATORY_CARE_PROVIDER_SITE_OTHER): Payer: Medicare Other | Admitting: Family Medicine

## 2012-05-27 VITALS — BP 142/80 | HR 75 | Temp 98.6°F | Wt 119.0 lb

## 2012-05-27 DIAGNOSIS — I10 Essential (primary) hypertension: Secondary | ICD-10-CM

## 2012-05-27 NOTE — Progress Notes (Signed)
  Subjective:    Patient ID: Linda Mays, female    DOB: Apr 03, 1941, 72 y.o.   MRN: [id number]  HPI Here to follow up on elevated BP readings in the past month. She has had some readings in the 140s over 90s in the GI office or here, and they recommended she see me about this. She has been checking these faithfully at home every day and brings in a log of them. She has been consistently in the 110s or 120s over 80s with elevated readings of 140/90 about once a week. She feels fine.    Review of Systems  Constitutional: Negative.   Respiratory: Negative.   Cardiovascular: Negative.        Objective:   Physical Exam  Constitutional: She appears well-developed and well-nourished.  Cardiovascular: Normal rate, regular rhythm, normal heart sounds and intact distal pulses.   Pulmonary/Chest: Effort normal and breath sounds normal.  Musculoskeletal: She exhibits no edema.          Assessment & Plan:  She seems to have some "white coat HTN" but her BP at home is fairly well controlled. She will continue to monitor this and we will follow up as scheduled

## 2012-06-03 ENCOUNTER — Encounter: Payer: Medicare Other | Admitting: Internal Medicine

## 2012-06-06 ENCOUNTER — Telehealth: Payer: Self-pay | Admitting: Family Medicine

## 2012-06-06 DIAGNOSIS — I10 Essential (primary) hypertension: Secondary | ICD-10-CM

## 2012-06-06 NOTE — Telephone Encounter (Signed)
Pt has switch pharm to [facility] in [place] [phone]. Please call metoprolol succinate 100mg  twice a day into phar.

## 2012-06-08 MED ORDER — METOPROLOL SUCCINATE ER 100 MG PO TB24: 100.0000 mg | ORAL_TABLET | Freq: Two times a day (BID) | ORAL | Status: DC

## 2012-06-08 NOTE — Telephone Encounter (Signed)
I sent script e-scribe. 

## 2012-07-13 ENCOUNTER — Encounter: Payer: Self-pay | Admitting: Family Medicine

## 2012-07-13 ENCOUNTER — Ambulatory Visit (INDEPENDENT_AMBULATORY_CARE_PROVIDER_SITE_OTHER): Payer: Medicare Other | Admitting: Family Medicine

## 2012-07-13 VITALS — BP 170/90 | HR 61 | Temp 98.5°F | Wt 118.0 lb

## 2012-07-13 DIAGNOSIS — R000 Tachycardia, unspecified: Secondary | ICD-10-CM

## 2012-07-13 DIAGNOSIS — I10 Essential (primary) hypertension: Secondary | ICD-10-CM | POA: Diagnosis not present

## 2012-07-13 MED ORDER — METOPROLOL SUCCINATE ER 100 MG PO TB24: ORAL_TABLET | ORAL | Status: DC

## 2012-07-13 MED ORDER — AMLODIPINE BESYLATE 5 MG PO TABS: 5.0000 mg | ORAL_TABLET | Freq: Every day | ORAL | Status: DC

## 2012-07-13 NOTE — Progress Notes (Signed)
  Subjective:    Patient ID: Linda Mays, female    DOB: 03/08/1941, 72 y.o.   MRN: [id number]  HPI Here for elevated BP for the past 3 days. Here BP at home has been as high as the 180s over 100s. She has had a few HAs but denies any SOB or chest pains. She had normal labs 3 months ago.    Review of Systems  Constitutional: Negative.   Respiratory: Negative.   Cardiovascular: Negative.   Neurological: Positive for headaches. Negative for dizziness and light-headedness.       Objective:   Physical Exam  Constitutional: She appears well-developed and well-nourished. No distress.  Neck: Neck supple. No thyromegaly present.  Cardiovascular: Normal rate, regular rhythm, normal heart sounds and intact distal pulses.   EKG is normal with a few PACs   Pulmonary/Chest: Effort normal and breath sounds normal.  Lymphadenopathy:    She has no cervical adenopathy.          Assessment & Plan:  We will increase the Metoprolol to 1 and 1/2 tabs bid and add Amlodipine 5 mg a day. Recheck in 2 weeks

## 2012-07-15 ENCOUNTER — Telehealth: Payer: Self-pay | Admitting: Family Medicine

## 2012-07-15 NOTE — Telephone Encounter (Signed)
I spoke with pt and also the pharmacy, just wanted to make sure that the dosage is updated.

## 2012-07-15 NOTE — Telephone Encounter (Signed)
Pt left a voice message, she does not have enough of B/P medications to last due to the increase.

## 2012-07-22 ENCOUNTER — Ambulatory Visit (INDEPENDENT_AMBULATORY_CARE_PROVIDER_SITE_OTHER): Payer: Medicare Other | Admitting: Family Medicine

## 2012-07-22 ENCOUNTER — Encounter: Payer: Self-pay | Admitting: Family Medicine

## 2012-07-22 VITALS — BP 172/94 | HR 63 | Temp 98.4°F | Wt 118.0 lb

## 2012-07-22 DIAGNOSIS — I10 Essential (primary) hypertension: Secondary | ICD-10-CM | POA: Diagnosis not present

## 2012-07-22 MED ORDER — METOPROLOL SUCCINATE ER 100 MG PO TB24: 200.0000 mg | ORAL_TABLET | Freq: Two times a day (BID) | ORAL | Status: DC

## 2012-07-22 MED ORDER — AMLODIPINE BESYLATE 10 MG PO TABS: 10.0000 mg | ORAL_TABLET | Freq: Every day | ORAL | Status: DC

## 2012-07-22 NOTE — Progress Notes (Signed)
  Subjective:    Patient ID: Linda Mays, female    DOB: Sep 06, 1940, 72 y.o.   MRN: [id number]  HPI Here to follow up HTN. At our last visit we increased the dose of her metoprolol and added amlodipine. Her morning BP has improved, averaging 120s -140s systolic, but the evenings can still jump to 160s or 170s. She feels flushed in the face at times but denies SOB or chest pains or HAs.    Review of Systems  Constitutional: Negative.   Respiratory: Negative.   Cardiovascular: Negative.        Objective:   Physical Exam  Constitutional: She appears well-developed and well-nourished.  Neck: No thyromegaly present.  Cardiovascular: Normal rate, regular rhythm, normal heart sounds and intact distal pulses.   Pulmonary/Chest: Effort normal and breath sounds normal.  Musculoskeletal: She exhibits no edema.  Lymphadenopathy:    She has no cervical adenopathy.          Assessment & Plan:  HTN which has been difficult to bring down. Set up a renal artery [place] to rule out stenosis. Increase metoprolol to 200 mg bid and increase amlodipine to 10 mg daily. Recheck in 2 weeks

## 2012-07-22 NOTE — Addendum Note (Signed)
Addended by: Gershon Crane A on: 07/22/2012 05:24 PM   Modules accepted: Orders

## 2012-07-27 ENCOUNTER — Ambulatory Visit: Payer: Medicare Other | Admitting: Family Medicine

## 2012-07-28 ENCOUNTER — Telehealth: Payer: Self-pay | Admitting: Internal Medicine

## 2012-07-28 ENCOUNTER — Telehealth: Payer: Self-pay | Admitting: Family Medicine

## 2012-07-28 NOTE — Telephone Encounter (Signed)
After increasing her metoprolol succinate (TOPROL XL) 100 MG 24 hr tablet.to 4 pillls, she has found out that her insurance will not pay for that amount. Pt said [facility] has sent fax.Linda Mays is pharm ) Pt says she is out and needs this RX ASAP. Pt's new med insurance is NOT in computer.  [employer]   [phone] : Plan # [id number]  [id number]                 RX Group: [id number]    PCN  [id number] RX BIN  [id number] Member ID [id number]

## 2012-07-29 ENCOUNTER — Encounter: Payer: Medicare Other | Admitting: Internal Medicine

## 2012-07-29 MED ORDER — METOPROLOL SUCCINATE ER 200 MG PO TB24: 200.0000 mg | ORAL_TABLET | Freq: Two times a day (BID) | ORAL | Status: DC

## 2012-07-29 NOTE — Telephone Encounter (Signed)
Change from metoprolol succinate 100 mg to 200 mg to take this one tab bid. Call in one year supply and cancel the other rx

## 2012-07-29 NOTE — Telephone Encounter (Signed)
Dr. Clent Ridges, Ms. Bury's insurance will not pay for her to take (4) 100mg  tabs of Toprol XL per day. Can we try a 200mg  tablet? [facility] [address] in [place]. Pam the pharmacist there knows all about situation. Patient will be out of pills by tomorrow.   (All other rx go to [facility] in [place])

## 2012-08-04 ENCOUNTER — Encounter (INDEPENDENT_AMBULATORY_CARE_PROVIDER_SITE_OTHER): Payer: Medicare Other

## 2012-08-04 DIAGNOSIS — I10 Essential (primary) hypertension: Secondary | ICD-10-CM

## 2012-08-08 NOTE — Progress Notes (Signed)
Quick Note:  I left voice message with results. ______ 

## 2012-08-10 ENCOUNTER — Telehealth: Payer: Self-pay | Admitting: Family Medicine

## 2012-08-10 ENCOUNTER — Telehealth: Payer: Self-pay | Admitting: Cardiology

## 2012-08-10 NOTE — Telephone Encounter (Signed)
appt scheduled by cardiology

## 2012-08-10 NOTE — Telephone Encounter (Signed)
Patient called stating that she would like to be referred to Dr. Daleen Squibb for a cardiac eval. Please assist.

## 2012-08-10 NOTE — Telephone Encounter (Signed)
New problem   Seen Dr. Daleen Squibb in  2010.    C/O heart racing in the middle of night. Blood pressure issues.  Will contact Dr. Clent Ridges office as well.

## 2012-08-10 NOTE — Telephone Encounter (Signed)
Pt states that she has contacted Dr Claris Che office concerning her heart racing and BP issues. She wants to schedule an appt to see one of the cardiologist here is this office. Call forwarded to check out to schedule.

## 2012-08-30 ENCOUNTER — Telehealth: Payer: Self-pay | Admitting: Family Medicine

## 2012-08-30 ENCOUNTER — Encounter: Payer: Self-pay | Admitting: Family Medicine

## 2012-08-30 ENCOUNTER — Ambulatory Visit (INDEPENDENT_AMBULATORY_CARE_PROVIDER_SITE_OTHER): Payer: Medicare Other | Admitting: Family Medicine

## 2012-08-30 ENCOUNTER — Encounter (INDEPENDENT_AMBULATORY_CARE_PROVIDER_SITE_OTHER): Payer: Medicare Other

## 2012-08-30 ENCOUNTER — Ambulatory Visit (INDEPENDENT_AMBULATORY_CARE_PROVIDER_SITE_OTHER): Payer: Medicare Other | Admitting: Cardiology

## 2012-08-30 ENCOUNTER — Encounter: Payer: Self-pay | Admitting: Cardiology

## 2012-08-30 VITALS — BP 130/64 | HR 71 | Ht 60.0 in | Wt 118.8 lb

## 2012-08-30 VITALS — BP 120/70 | HR 70 | Temp 98.4°F | Wt 118.0 lb

## 2012-08-30 DIAGNOSIS — N390 Urinary tract infection, site not specified: Secondary | ICD-10-CM | POA: Diagnosis not present

## 2012-08-30 DIAGNOSIS — I10 Essential (primary) hypertension: Secondary | ICD-10-CM | POA: Diagnosis not present

## 2012-08-30 DIAGNOSIS — R002 Palpitations: Secondary | ICD-10-CM

## 2012-08-30 DIAGNOSIS — I499 Cardiac arrhythmia, unspecified: Secondary | ICD-10-CM | POA: Diagnosis not present

## 2012-08-30 DIAGNOSIS — R319 Hematuria, unspecified: Secondary | ICD-10-CM | POA: Diagnosis not present

## 2012-08-30 LAB — TSH: TSH: 1.57 u[IU]/mL (ref 0.35–5.50)

## 2012-08-30 LAB — BASIC METABOLIC PANEL
BUN: 12 mg/dL (ref 6–23)
CO2: 25 mEq/L (ref 19–32)
Calcium: 9.4 mg/dL (ref 8.4–10.5)
Chloride: 102 mEq/L (ref 96–112)
Creatinine, Ser: 0.8 mg/dL (ref 0.4–1.2)
GFR: 75.65 mL/min (ref >60.00)
Glucose, Bld: 99 mg/dL (ref 70–99)
Potassium: 3.8 mEq/L (ref 3.5–5.1)
Sodium: 136 mEq/L (ref 135–145)

## 2012-08-30 LAB — POCT URINALYSIS DIPSTICK
Bilirubin, UA: NEGATIVE
Glucose, UA: NEGATIVE
Ketones, UA: NEGATIVE
Nitrite, UA: NEGATIVE
Protein, UA: NEGATIVE
Spec Grav, UA: 1.01
Urobilinogen, UA: 0.2
pH, UA: 6.5

## 2012-08-30 MED ORDER — NITROFURANTOIN MONOHYD MACRO 100 MG PO CAPS: 100.0000 mg | ORAL_CAPSULE | Freq: Two times a day (BID) | ORAL | Status: DC

## 2012-08-30 NOTE — Patient Instructions (Addendum)
LABS TODAY; BMET, TSH  24 HOUR HOLTER MONITOR DX 785.1  FOLLOW UP AS NEEDED PER DR. HOCHREIN

## 2012-08-30 NOTE — Progress Notes (Signed)
  Subjective:    Patient ID: Linda Mays, female    DOB: [birth year], 71 y.o.   MRN: [id number]  HPI Here for the onset last night of blood in the urine. She has also had some mild low back pain and burning on urination for several days. No fever or nausea.    Review of Systems  Constitutional: Negative.   Gastrointestinal: Negative.   Genitourinary: Positive for dysuria and hematuria. Negative for urgency, frequency, flank pain and pelvic pain.       Objective:   Physical Exam  Constitutional: She appears well-developed and well-nourished.  Abdominal: Soft. Bowel sounds are normal. She exhibits no distension and no mass. There is no rebound and no guarding.  Mildly tender above the pubis           Assessment & Plan:  Treat with Macrobid. Drink lots of water. Culture the urine

## 2012-08-30 NOTE — Progress Notes (Signed)
HPI  The patient presents for evaluation of hypertension and palpitations. She had previously seen Dr. Daleen Squibb but has not seen him in greater than 3 years.. She had a history of PVCs. However, she has had no real symptoms associated with this. In March she noticed episodes of her rapid heart rate particularly at night. These or sustained rapid beats and not like anything she's had with PVCs in the past. She also is having problems with her blood pressure going up. She saw Dr. Clent Ridges and he increase her metoprolol. He also started Norvasc and subsequently he increase this to 10 mg.  However, she developed edema with this and cut it back to 5 mg. After a few weeks she did have improvement of her palpitations rarely having the rapid beats now. Her blood pressure finally has come back to target. She's active doing chores but doesn't exercise. With her level of activity she denies any chest pressure, neck or arm discomfort. She's not had any shortness of breath, PND or orthopnea. Despite the palpitations she's had no presyncope or syncope.  Allergies  Allergen Reactions  . Lisinopril-Hydrochlorothiazide Other (See Comments)    Hot flashes  . Ace Inhibitors     angioedema  . Doxycycline   . Iophen C-Nr (Guaifenesin-Codeine)   . Penicillins   . Sulfonamide Derivatives     Current Outpatient Prescriptions  Medication Sig Dispense Refill  . amLODipine (NORVASC) 5 MG tablet Take 5 mg by mouth daily.      . metoprolol (TOPROL XL) 200 MG 24 hr tablet Take 1 tablet (200 mg total) by mouth 2 (two) times daily.  60 tablet  11  . nitrofurantoin, macrocrystal-monohydrate, (MACROBID) 100 MG capsule Take 1 capsule (100 mg total) by mouth 2 (two) times daily.  14 capsule  0  . SYNTHROID 50 MCG tablet Take 1 tablet (50 mcg total) by mouth daily.  30 tablet  6   No current facility-administered medications for this visit.    Past Medical History  Diagnosis Date  . Osteoporosis   . Hypertension   .  Hypothyroidism   . GERD (gastroesophageal reflux disease)   . History of colonic polyps   . Routine gynecological examination     sees Dr. Seymour Bars   . Osteoarthritis   . Dyslipidemia     Past Surgical History  Procedure Laterality Date  . Abdominal hysterectomy    . Oophorectomy    . Breast lumpectomy    . Bladder tack procedure      Family History  Problem Relation Age of Onset  . Colon cancer Other     1st degree relative<60  . Diabetes Other     1st degree relative  . Hypertension Other   . Prostate cancer Other     1st degree relative <50  . Diverticulitis Mother   . CAD Father 61    History   Social History  . Marital Status: Divorced    Spouse Name: N/A    Number of Children: 2  . Years of Education: N/A   Occupational History  . Retired    Social History Main Topics  . Smoking status: Never Smoker   . Smokeless tobacco: Never Used  . Alcohol Use: No  . Drug Use: No  . Sexually Active: Not on file   Other Topics Concern  . Not on file   Social History Narrative   Lives at home by herself    ROS:   Positive for hematuria.  Otherwise  as stated in the HPI and negative for all other systems.   PHYSICAL EXAM BP 130/64  Pulse 71  Ht 5' (1.524 m)  Wt 118 lb 12.8 oz (53.887 kg)  BMI 23.2 kg/m2 GENERAL:  Well appearing HEENT:  Pupils equal round and reactive, fundi not visualized, oral mucosa unremarkable NECK:  No jugular venous distention, waveform within normal limits, carotid upstroke brisk and symmetric, no bruits, no thyromegaly LYMPHATICS:  No cervical, inguinal adenopathy LUNGS:  Clear to auscultation bilaterally BACK:  No CVA tenderness CHEST:  Unremarkable HEART:  PMI not displaced or sustained,S1 and S2 within normal limits, no S3, no S4, no clicks, no rubs, no murmurs ABD:  Flat, positive bowel sounds normal in frequency in pitch, no bruits, no rebound, no guarding, no midline pulsatile mass, no hepatomegaly, no splenomegaly EXT:  2  plus pulses throughout, no edema, no cyanosis no clubbing SKIN:  No rashes no nodules NEURO:  Cranial nerves II through XII grossly intact, motor grossly intact throughout [facility]:  Cognitively intact, oriented to person place and time  EKG:  07/13/12 Normal sinus rhythm, rate 90, axis within normal limits, intervals within normal limits, ventricular ectopy, no acute ST-T wave changes. 08/30/2012  ASSESSMENT AND PLAN  PALPITATIONS:  She is not noticing sustained rapid rate that she had. She doesn't notice the PVCs. I will apply a 24-hour Holter monitor as she would need an event monitor if she has any increasing symptoms that we do not capture otherwise. For now since symptoms are better than worse I will not change her regimen. I will check a basic metabolic profile and a TSH. I  HTN;  Her blood pressure currently seems to be well controlled so I will continue the meds as listed. She seems to be tolerating the maximum dose of beta blocker.

## 2012-08-30 NOTE — Telephone Encounter (Signed)
Patient Information:  Caller Name: Sonyia  Phone: [phone]  Patient: Linda Mays  Gender: Female  DOB: 06/02/1940  Age: 73 Years  PCP: Gershon Crane [facility])  Office Follow Up:  Does the office need to follow up with this patient?: No  Instructions For The Office: N/A   Symptoms  Reason For Call & Symptoms: noting blood tinged urine started late yesterday evening 5/5.  Slight burning with urination.  Reviewed Health History In EMR: Yes  Reviewed Medications In EMR: Yes  Reviewed Allergies In EMR: Yes  Reviewed Surgeries / Procedures: Yes  Date of Onset of Symptoms: 08/29/2012  Treatments Tried: increased fluids  Treatments Tried Worked: No  Guideline(s) Used:  Urination Pain - Female  Disposition Per Guideline:   Go to Office Now  Reason For Disposition Reached:   Side (flank) or lower back pain present  Advice Given:  Fluids:   Drink extra fluids. Drink 8-10 glasses of liquids a day (Reason: to produce a dilute, non-irritating urine).  Cranberry Juice:   Dosage Cranberry Juice Cocktail: 8 oz (240 ml) twice a day.  Warm Saline SITZ Baths to Reduce Pain:  Sit in a warm saline bath for 20 minutes to cleanse the area and to reduce pain. Add 2 oz. of table salt or baking soda to a tub of water.  Call Back If:  You become worse.  Patient Will Follow Care Advice:  YES  Appointment Scheduled:  08/30/2012 10:30:00 Appointment Scheduled Provider:  Gershon Crane [facility])

## 2012-09-01 LAB — URINE CULTURE: Colony Count: 10000

## 2012-09-02 NOTE — Progress Notes (Signed)
Quick Note:  Called and spoke with pt and pt is aware. ______ 

## 2012-09-16 ENCOUNTER — Telehealth: Payer: Self-pay | Admitting: Cardiology

## 2012-09-16 NOTE — Telephone Encounter (Signed)
Reviewed preliminary monitor results with pt.  She is aware that I do not have a signed report from Dr Antoine Poche at this point but will call her once I do.  BP has been 132-97/50-60 and she reports feeling fine.

## 2012-09-16 NOTE — Telephone Encounter (Signed)
Follow Up      Pt calling in following up on monitor results. Please call.

## 2012-09-30 ENCOUNTER — Ambulatory Visit (INDEPENDENT_AMBULATORY_CARE_PROVIDER_SITE_OTHER): Payer: Medicare Other | Admitting: Cardiology

## 2012-09-30 ENCOUNTER — Encounter: Payer: Self-pay | Admitting: Cardiology

## 2012-09-30 VITALS — BP 132/74 | HR 64 | Ht 60.0 in | Wt 121.4 lb

## 2012-09-30 DIAGNOSIS — I4949 Other premature depolarization: Secondary | ICD-10-CM

## 2012-09-30 DIAGNOSIS — I493 Ventricular premature depolarization: Secondary | ICD-10-CM

## 2012-09-30 NOTE — Progress Notes (Signed)
   HPI  The patient presents for evaluation of hypertension and palpitations. 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 Her PVC burping with about 25%. However, she currently says she's really not having any symptoms. She's not really feeling palpitations and she has no presyncope or syncope. She has no chest pressure, neck or arm discomfort. She has no weight gain or edema. Her blood pressure has been well controlled for the most part and I reviewed the blood pressure diary.  Allergies  Allergen Reactions  . Lisinopril-Hydrochlorothiazide Other (See Comments)    Hot flashes  . Ace Inhibitors     angioedema  . Doxycycline   . Iophen C-Nr (Guaifenesin-Codeine)   . Penicillins   . Sulfonamide Derivatives     Current Outpatient Prescriptions  Medication Sig Dispense Refill  . amLODipine (NORVASC) 5 MG tablet Take 5 mg by mouth daily.      . metoprolol (TOPROL XL) 200 MG 24 hr tablet Take 1 tablet (200 mg total) by mouth 2 (two) times daily.  60 tablet  11  . SYNTHROID 50 MCG tablet Take 1 tablet (50 mcg total) by mouth daily.  30 tablet  6   No current facility-administered medications for this visit.    Past Medical History  Diagnosis Date  . Osteoporosis   . Hypertension   . Hypothyroidism   . GERD (gastroesophageal reflux disease)   . History of colonic polyps   . Routine gynecological examination     sees Dr. Seymour Bars   . Osteoarthritis   . Dyslipidemia     Past Surgical History  Procedure Laterality Date  . Abdominal hysterectomy    . Oophorectomy    . Breast lumpectomy    . Bladder tack procedure      ROS:   As stated in the HPI and negative for all other systems.   PHYSICAL EXAM BP 132/74  Pulse 64  Ht 5' (1.524 m)  Wt 121 lb 6.4 oz (55.067 kg)  BMI 23.71 kg/m2 GENERAL:  Well appearing NECK:  No jugular venous distention, waveform within normal limits, carotid upstroke brisk and symmetric, no  bruits, no thyromegaly LUNGS:  Clear to auscultation bilaterally CHEST:  Unremarkable HEART:  PMI not displaced or sustained,S1 and S2 within normal limits, no S3, no S4, no clicks, no rubs, no murmurs ABD:  Flat, positive bowel sounds normal in frequency in pitch, no bruits, no rebound, no guarding, no midline pulsatile mass, no hepatomegaly, no splenomegaly EXT:  2 plus pulses throughout, no edema, no cyanosis no clubbing  ASSESSMENT AND PLAN  PALPITATIONS:  Despite the fact that she has frequent PVCs she's not having any symptoms. I will check an echocardiogram to sure she has normal left ventricular function and no structural heart disease. However, I am not otherwise suspecting that she will need further evaluation.  HTN;  Her blood pressure currently seems to be well controlled so I will continue the meds as listed. She seems to be tolerating the maximum dose of beta blocker.

## 2012-09-30 NOTE — Patient Instructions (Addendum)
The current medical regimen is effective;  continue present plan and medications.  Your physician has requested that you have an echocardiogram. Echocardiography is a painless test that uses sound waves to create images of your heart. It provides your doctor with information about the size and shape of your heart and how well your heart's chambers and valves are working. This procedure takes approximately one hour. There are no restrictions for this procedure.  Follow up will be based on these results. 

## 2012-10-17 ENCOUNTER — Ambulatory Visit (HOSPITAL_COMMUNITY): Payer: Medicare Other | Attending: Cardiology | Admitting: Radiology

## 2012-10-17 DIAGNOSIS — I498 Other specified cardiac arrhythmias: Secondary | ICD-10-CM | POA: Diagnosis not present

## 2012-10-17 DIAGNOSIS — I059 Rheumatic mitral valve disease, unspecified: Secondary | ICD-10-CM | POA: Diagnosis not present

## 2012-10-17 DIAGNOSIS — I493 Ventricular premature depolarization: Secondary | ICD-10-CM

## 2012-10-17 DIAGNOSIS — I499 Cardiac arrhythmia, unspecified: Secondary | ICD-10-CM | POA: Diagnosis not present

## 2012-10-17 DIAGNOSIS — I471 Supraventricular tachycardia, unspecified: Secondary | ICD-10-CM | POA: Insufficient documentation

## 2012-10-17 DIAGNOSIS — I4949 Other premature depolarization: Secondary | ICD-10-CM | POA: Diagnosis not present

## 2012-10-17 NOTE — Progress Notes (Signed)
Echocardiogram performed.  

## 2012-11-02 ENCOUNTER — Telehealth: Payer: Self-pay | Admitting: Family Medicine

## 2012-11-02 MED ORDER — METOPROLOL SUCCINATE ER 200 MG PO TB24: 200.0000 mg | ORAL_TABLET | Freq: Two times a day (BID) | ORAL | Status: DC

## 2012-11-02 NOTE — Telephone Encounter (Signed)
Pt requested a 90 day supply of Metoprolol ER 200 mg take 1 po bid and [facility]. I did send script e-scribe.

## 2012-11-08 ENCOUNTER — Encounter: Payer: Self-pay | Admitting: Internal Medicine

## 2012-11-08 ENCOUNTER — Ambulatory Visit (INDEPENDENT_AMBULATORY_CARE_PROVIDER_SITE_OTHER): Payer: Medicare Other | Admitting: Internal Medicine

## 2012-11-08 VITALS — BP 136/86 | HR 66 | Temp 97.6°F | Wt 120.0 lb

## 2012-11-08 DIAGNOSIS — R300 Dysuria: Secondary | ICD-10-CM

## 2012-11-08 DIAGNOSIS — N390 Urinary tract infection, site not specified: Secondary | ICD-10-CM

## 2012-11-08 LAB — POCT URINALYSIS DIPSTICK
Bilirubin, UA: NEGATIVE
Blood, UA: 1
Glucose, UA: NEGATIVE
Ketones, UA: NEGATIVE
Nitrite, UA: NEGATIVE
Protein, UA: NEGATIVE
Spec Grav, UA: 1.01
Urobilinogen, UA: 0.2
pH, UA: 6.5

## 2012-11-08 MED ORDER — NITROFURANTOIN MONOHYD MACRO 100 MG PO CAPS: 100.0000 mg | ORAL_CAPSULE | Freq: Two times a day (BID) | ORAL | Status: DC

## 2012-11-08 NOTE — Patient Instructions (Signed)
Begin antibiotic  Medication Will let you know about.   Culture when  Back Sometimes  Lack of estrogens and voiding  Problems and  Add ot the problem.

## 2012-11-08 NOTE — Progress Notes (Signed)
Chief Complaint  Patient presents with  . Dysuria    HPI: Patient comes in today for SDA for  new problem evaluation. PCP NA Last uti rx was 5 14 with macrobid low count e coli pansensitive  Has been getting them more frequently thinks it's from her blood pressure medicine because she has more dryness. Today's only had a couple days of symptoms dysuria no fever or low back pain but no flank pain.  Remote history of having seen a neurologist has no obstruction but doesn't empty bladder well. Sensitive to many antibiotics did well on the last antibiotic given a few months ago.   ROS: See pertinent positives and negatives per HPI. Hypertension but no current chest pain had swelling with high-dose amlodipine.  Past Medical History  Diagnosis Date  . Osteoporosis   . Hypertension   . Hypothyroidism   . GERD (gastroesophageal reflux disease)   . History of colonic polyps   . Routine gynecological examination     sees Dr. Seymour Bars   . Osteoarthritis   . Dyslipidemia     Family History  Problem Relation Age of Onset  . Colon cancer Other     1st degree relative<60  . Diabetes Other     1st degree relative  . Hypertension Other   . Prostate cancer Other     1st degree relative <50  . Diverticulitis Mother   . CAD Father 58    History   Social History  . Marital Status: Divorced    Spouse Name: N/A    Number of Children: 2  . Years of Education: N/A   Occupational History  . Retired    Social History Main Topics  . Smoking status: Never Smoker   . Smokeless tobacco: Never Used  . Alcohol Use: No  . Drug Use: No  . Sexually Active: None   Other Topics Concern  . None   Social History Narrative   Lives at home by herself    Outpatient Encounter Prescriptions as of 11/08/2012  Medication Sig Dispense Refill  . amLODipine (NORVASC) 5 MG tablet Take 5 mg by mouth daily.      . metoprolol (TOPROL XL) 200 MG 24 hr tablet Take 1 tablet (200 mg total) by mouth 2 (two)  times daily.  180 tablet  2  . SYNTHROID 50 MCG tablet Take 1 tablet (50 mcg total) by mouth daily.  30 tablet  6  . nitrofurantoin, macrocrystal-monohydrate, (MACROBID) 100 MG capsule Take 1 capsule (100 mg total) by mouth 2 (two) times daily.  14 capsule  0   No facility-administered encounter medications on file as of 11/08/2012.    EXAM:  BP 136/86  Pulse 66  Temp(Src) 97.6 F (36.4 C) (Oral)  Wt 120 lb (54.432 kg)  BMI 23.44 kg/m2  SpO2 99%  Body mass index is 23.44 kg/(m^2).  GENERAL: vitals reviewed and listed above, alert, oriented, appears well hydrated and in no acute distress HEENT: atraumatic, conjunctiva  clear, no obvious abnormalities on inspection of external nose and ears  NECK: no obvious masses on inspection palpation  CV: HRRR, no clubbing cyanosis or  peripheral edema nl cap refill  Abdomen:  Sof,t normal bowel sounds without hepatosplenomegaly, no guarding rebound or masses no CVA tenderness mild LBP  MS: moves all extremities without noticeable focal  abnormality PSYCH: pleasant and cooperative, no obvious depression or anxiety ua 2 + leuk 1+ blood  ASSESSMENT AND PLAN:  Discussed the following assessment and plan:  Dysuria - Plan: POC Urinalysis Dipstick, Culture, Urine  UTI (urinary tract infection) - Plan: Culture, Urine  Recurrent UTI Hx of uro evaql in past    May have voiding issues but no obst . reculture  macrobid has sensitivity to many meds and tolerates well.  Discussed prevention options following for persistent and progressive uncertain whether vaginal estrogen would be helpful in her situation. Followup with Dr. Clent Ridges in regard to blood pressure control or her cardiologist.  -Patient advised to return or notify health care team  if symptoms worsen or persist or new concerns arise.  Patient Instructions  Begin antibiotic  Medication Will let you know about.   Culture when  Back Sometimes  Lack of estrogens and voiding  Problems and  Add ot  the problem.      Neta Mends. Brittney Caraway M.D.

## 2012-11-11 ENCOUNTER — Other Ambulatory Visit: Payer: Self-pay | Admitting: Family Medicine

## 2012-11-11 LAB — URINE CULTURE: Colony Count: 60000

## 2012-11-11 MED ORDER — CIPROFLOXACIN HCL 250 MG PO TABS: 250.0000 mg | ORAL_TABLET | Freq: Two times a day (BID) | ORAL | Status: DC

## 2012-11-15 DIAGNOSIS — H04129 Dry eye syndrome of unspecified lacrimal gland: Secondary | ICD-10-CM | POA: Diagnosis not present

## 2012-12-02 ENCOUNTER — Other Ambulatory Visit: Payer: Self-pay | Admitting: Family Medicine

## 2013-01-19 ENCOUNTER — Telehealth: Payer: Self-pay | Admitting: Family Medicine

## 2013-01-19 NOTE — Telephone Encounter (Signed)
Per Dr. Clent Ridges okay to schedule for Friday 01/20/13.

## 2013-01-19 NOTE — Telephone Encounter (Signed)
Pt has a pain in rt side for a wk, not getting better. causing bp to be high. Pt would like an appt tomorrow. pls advise.Marland Kitchen

## 2013-01-19 NOTE — Telephone Encounter (Signed)
done

## 2013-01-20 ENCOUNTER — Ambulatory Visit (INDEPENDENT_AMBULATORY_CARE_PROVIDER_SITE_OTHER): Payer: Medicare Other | Admitting: Family Medicine

## 2013-01-20 ENCOUNTER — Encounter: Payer: Self-pay | Admitting: Family Medicine

## 2013-01-20 VITALS — BP 138/80 | HR 61 | Temp 98.0°F | Wt 118.0 lb

## 2013-01-20 DIAGNOSIS — IMO0002 Reserved for concepts with insufficient information to code with codable children: Secondary | ICD-10-CM | POA: Diagnosis not present

## 2013-01-20 DIAGNOSIS — S39011A Strain of muscle, fascia and tendon of abdomen, initial encounter: Secondary | ICD-10-CM

## 2013-01-20 LAB — POCT URINALYSIS DIPSTICK
Bilirubin, UA: NEGATIVE
Blood, UA: NEGATIVE
Glucose, UA: NEGATIVE
Ketones, UA: NEGATIVE
Nitrite, UA: NEGATIVE
Protein, UA: NEGATIVE
Spec Grav, UA: 1.01
Urobilinogen, UA: 0.2
pH, UA: 6

## 2013-01-20 NOTE — Addendum Note (Signed)
Addended by: Aniceto Boss A on: 01/20/2013 12:42 PM   Modules accepted: Orders

## 2013-01-20 NOTE — Progress Notes (Signed)
  Subjective:    Patient ID: Linda Mays, female    DOB: 1940/06/13, 72 y.o.   MRN: [id number]  HPI  here for one week of RLQ abdominal pains. This started shortly after she and a friend were moving a heavy box. She felt a twinge at the time and the pain worsened over the next 24 hours. She has taken Tylenol for it. No urinary or bowel sx. No fevers. The pain peaked about 2 days ago and is now getting better. She wanted to check it before the weekend came.    Review of Systems  Constitutional: Negative.   Gastrointestinal: Positive for abdominal pain. Negative for nausea, vomiting, diarrhea, constipation, blood in stool, abdominal distention, anal bleeding and rectal pain.  Genitourinary: Negative.        Objective:   Physical Exam  Constitutional: She appears well-developed and well-nourished. No distress.  Abdominal: Soft. Bowel sounds are normal. She exhibits no distension and no mass. There is no rebound and no guarding.  Slightly tender in the RLQ. No masses or hernias felt           Assessment & Plan:  She has a strain of a deep pelvic muscle. This should continue to heal over the next several weeks. Rest with no heavy lifting.

## 2013-02-14 ENCOUNTER — Ambulatory Visit (INDEPENDENT_AMBULATORY_CARE_PROVIDER_SITE_OTHER): Payer: Medicare Other

## 2013-02-14 DIAGNOSIS — Z23 Encounter for immunization: Secondary | ICD-10-CM

## 2013-03-29 ENCOUNTER — Encounter: Payer: Self-pay | Admitting: Family Medicine

## 2013-03-29 ENCOUNTER — Ambulatory Visit (INDEPENDENT_AMBULATORY_CARE_PROVIDER_SITE_OTHER): Payer: Medicare Other | Admitting: Family Medicine

## 2013-03-29 VITALS — BP 132/68 | HR 67 | Temp 98.5°F | Wt 118.0 lb

## 2013-03-29 DIAGNOSIS — J019 Acute sinusitis, unspecified: Secondary | ICD-10-CM | POA: Diagnosis not present

## 2013-03-29 MED ORDER — AZITHROMYCIN 250 MG PO TABS: ORAL_TABLET | ORAL | Status: DC

## 2013-03-29 MED ORDER — AMLODIPINE BESYLATE 5 MG PO TABS: 5.0000 mg | ORAL_TABLET | Freq: Every day | ORAL | Status: DC

## 2013-03-29 NOTE — Progress Notes (Signed)
Pre visit review using our clinic review tool, if applicable. No additional management support is needed unless otherwise documented below in the visit note. 

## 2013-03-29 NOTE — Progress Notes (Signed)
   Subjective:    Patient ID: [name], female    DOB: October 04, 1940, 72 y.o.   MRN: [id number]  HPI Here for 4 days of sinus pressure, PND, and ST. No cough.    Review of Systems  Constitutional: Negative.   HENT: Positive for congestion, postnasal drip and sinus pressure.   Eyes: Negative.   Respiratory: Negative.        Objective:   Physical Exam  Constitutional: She appears well-developed and well-nourished.  HENT:  Right Ear: External ear normal.  Left Ear: External ear normal.  Nose: Nose normal.  Mouth/Throat: Oropharynx is clear and moist.  Eyes: Conjunctivae are normal.  Pulmonary/Chest: Effort normal and breath sounds normal.  Lymphadenopathy:    She has no cervical adenopathy.          Assessment & Plan:  Add Mucinex

## 2013-03-31 ENCOUNTER — Other Ambulatory Visit: Payer: Self-pay | Admitting: Family Medicine

## 2013-06-02 DIAGNOSIS — L219 Seborrheic dermatitis, unspecified: Secondary | ICD-10-CM | POA: Diagnosis not present

## 2013-06-02 DIAGNOSIS — L981 Factitial dermatitis: Secondary | ICD-10-CM | POA: Diagnosis not present

## 2013-07-24 ENCOUNTER — Other Ambulatory Visit: Payer: Self-pay | Admitting: Family Medicine

## 2013-08-23 DIAGNOSIS — L570 Actinic keratosis: Secondary | ICD-10-CM | POA: Diagnosis not present

## 2013-08-23 DIAGNOSIS — C4441 Basal cell carcinoma of skin of scalp and neck: Secondary | ICD-10-CM | POA: Diagnosis not present

## 2013-08-23 DIAGNOSIS — D485 Neoplasm of uncertain behavior of skin: Secondary | ICD-10-CM | POA: Diagnosis not present

## 2013-09-07 DIAGNOSIS — Z85828 Personal history of other malignant neoplasm of skin: Secondary | ICD-10-CM | POA: Diagnosis not present

## 2013-09-07 DIAGNOSIS — C4441 Basal cell carcinoma of skin of scalp and neck: Secondary | ICD-10-CM | POA: Diagnosis not present

## 2013-10-13 ENCOUNTER — Encounter: Payer: Self-pay | Admitting: Family Medicine

## 2013-10-13 ENCOUNTER — Ambulatory Visit (INDEPENDENT_AMBULATORY_CARE_PROVIDER_SITE_OTHER): Payer: Medicare Other | Admitting: Family Medicine

## 2013-10-13 VITALS — BP 115/61 | HR 66 | Temp 98.6°F | Ht 60.0 in | Wt 120.0 lb

## 2013-10-13 DIAGNOSIS — Z0000 Encounter for general adult medical examination without abnormal findings: Secondary | ICD-10-CM

## 2013-10-13 DIAGNOSIS — R350 Frequency of micturition: Secondary | ICD-10-CM | POA: Diagnosis not present

## 2013-10-13 DIAGNOSIS — N390 Urinary tract infection, site not specified: Secondary | ICD-10-CM | POA: Diagnosis not present

## 2013-10-13 LAB — POCT URINALYSIS DIPSTICK
Bilirubin, UA: NEGATIVE
Blood, UA: NEGATIVE
Glucose, UA: NEGATIVE
Ketones, UA: NEGATIVE
Leukocytes, UA: NEGATIVE
Nitrite, UA: NEGATIVE
Protein, UA: NEGATIVE
Spec Grav, UA: <=1.005
Urobilinogen, UA: 0.2
pH, UA: 5

## 2013-10-13 MED ORDER — CIPROFLOXACIN HCL 500 MG PO TABS: 500.0000 mg | ORAL_TABLET | Freq: Two times a day (BID) | ORAL | Status: DC

## 2013-10-13 NOTE — Progress Notes (Signed)
Pre visit review using our clinic review tool, if applicable. No additional management support is needed unless otherwise documented below in the visit note. 

## 2013-10-13 NOTE — Progress Notes (Signed)
   Subjective:    Patient ID: Linda Mays, female    DOB: 10/22/1940, 73 y.o.   MRN: [id number]  HPI Here for 3 days of urinary urgency and burning. No fever. She also asks for stool cards to check for occult blood.    Review of Systems  Constitutional: Negative.   Gastrointestinal: Negative.   Genitourinary: Positive for dysuria, urgency and frequency.       Objective:   Physical Exam  Constitutional: She is oriented to person, place, and time. She appears well-developed and well-nourished.  Abdominal: Soft. Bowel sounds are normal. She exhibits no distension and no mass. There is no tenderness. There is no rebound and no guarding.  Neurological: She is alert and oriented to person, place, and time.          Assessment & Plan:  Probable early UTI. Culture the sample and start on Cipro. Drink fluids.

## 2013-10-16 LAB — URINE CULTURE: Colony Count: 50000

## 2013-10-26 ENCOUNTER — Other Ambulatory Visit: Payer: Self-pay | Admitting: Family Medicine

## 2013-10-31 DIAGNOSIS — Z1231 Encounter for screening mammogram for malignant neoplasm of breast: Secondary | ICD-10-CM | POA: Diagnosis not present

## 2013-11-10 ENCOUNTER — Encounter: Payer: Self-pay | Admitting: Family Medicine

## 2013-12-26 ENCOUNTER — Other Ambulatory Visit: Payer: Self-pay | Admitting: Family Medicine

## 2014-01-11 ENCOUNTER — Encounter: Payer: Self-pay | Admitting: Family Medicine

## 2014-01-11 ENCOUNTER — Ambulatory Visit (INDEPENDENT_AMBULATORY_CARE_PROVIDER_SITE_OTHER): Payer: Medicare Other | Admitting: Family Medicine

## 2014-01-11 VITALS — BP 154/79 | HR 66 | Temp 98.8°F | Ht 60.0 in | Wt 122.0 lb

## 2014-01-11 DIAGNOSIS — I10 Essential (primary) hypertension: Secondary | ICD-10-CM

## 2014-01-11 DIAGNOSIS — E039 Hypothyroidism, unspecified: Secondary | ICD-10-CM

## 2014-01-11 LAB — BASIC METABOLIC PANEL
BUN: 18 mg/dL (ref 6–23)
CO2: 27 mEq/L (ref 19–32)
Calcium: 10.1 mg/dL (ref 8.4–10.5)
Chloride: 102 mEq/L (ref 96–112)
Creatinine, Ser: 0.9 mg/dL (ref 0.4–1.2)
GFR: 69.27 mL/min (ref >60.00)
Glucose, Bld: 95 mg/dL (ref 70–99)
Potassium: 5.1 mEq/L (ref 3.5–5.1)
Sodium: 138 mEq/L (ref 135–145)

## 2014-01-11 LAB — CBC WITH DIFFERENTIAL/PLATELET
Basophils Absolute: 0 10*3/uL (ref 0.0–0.1)
Basophils Relative: 0.3 % (ref 0.0–3.0)
Eosinophils Absolute: 0.2 10*3/uL (ref 0.0–0.7)
Eosinophils Relative: 2.9 % (ref 0.0–5.0)
HCT: 45.2 % (ref 36.0–46.0)
Hemoglobin: 15.4 g/dL — ABNORMAL HIGH (ref 12.0–15.0)
Lymphocytes Relative: 40.2 % (ref 12.0–46.0)
Lymphs Abs: 2.6 10*3/uL (ref 0.7–4.0)
MCHC: 33.9 g/dL (ref 30.0–36.0)
MCV: 91.9 fl (ref 78.0–100.0)
Monocytes Absolute: 0.7 10*3/uL (ref 0.1–1.0)
Monocytes Relative: 11 % (ref 3.0–12.0)
Neutro Abs: 2.9 10*3/uL (ref 1.4–7.7)
Neutrophils Relative %: 45.6 % (ref 43.0–77.0)
Platelets: 219 10*3/uL (ref 150.0–400.0)
RBC: 4.92 Mil/uL (ref 3.87–5.11)
RDW: 12.5 % (ref 11.5–15.5)
WBC: 6.4 10*3/uL (ref 4.0–10.5)

## 2014-01-11 LAB — HEPATIC FUNCTION PANEL
ALT: 26 U/L (ref 0–35)
AST: 24 U/L (ref 0–37)
Albumin: 4.4 g/dL (ref 3.5–5.2)
Alkaline Phosphatase: 84 U/L (ref 39–117)
Bilirubin, Direct: 0 mg/dL (ref 0.0–0.3)
Total Bilirubin: 0.5 mg/dL (ref 0.2–1.2)
Total Protein: 8 g/dL (ref 6.0–8.3)

## 2014-01-11 LAB — TSH: TSH: 0.33 u[IU]/mL — ABNORMAL LOW (ref 0.35–4.50)

## 2014-01-11 MED ORDER — HYDROCHLOROTHIAZIDE 25 MG PO TABS: 25.0000 mg | ORAL_TABLET | Freq: Every day | ORAL | Status: DC

## 2014-01-11 NOTE — Progress Notes (Signed)
   Subjective:    Patient ID: Linda Mays, female    DOB: [birth year], 73 y.o.   MRN: [id number]  HPI Here with concerns about her BP going up. At home she has been getting systolic readings up to 505L at times. She feels fine.    Review of Systems  Constitutional: Negative.   Respiratory: Negative.   Cardiovascular: Negative.   Neurological: Negative.        Objective:   Physical Exam  Constitutional: She is oriented to person, place, and time. She appears well-developed and well-nourished.  Cardiovascular: Normal rate, regular rhythm, normal heart sounds and intact distal pulses.   Pulmonary/Chest: Effort normal and breath sounds normal.  Musculoskeletal: She exhibits no edema.  Neurological: She is alert and oriented to person, place, and time.          Assessment & Plan:  Add HCTZ 25 mg daily. Get labs today. Recheck one month

## 2014-01-11 NOTE — Progress Notes (Signed)
Pre visit review using our clinic review tool, if applicable. No additional management support is needed unless otherwise documented below in the visit note. 

## 2014-01-16 ENCOUNTER — Other Ambulatory Visit: Payer: Self-pay | Admitting: Family Medicine

## 2014-01-16 DIAGNOSIS — I10 Essential (primary) hypertension: Secondary | ICD-10-CM

## 2014-01-24 ENCOUNTER — Other Ambulatory Visit: Payer: Self-pay | Admitting: Family Medicine

## 2014-02-20 ENCOUNTER — Ambulatory Visit (INDEPENDENT_AMBULATORY_CARE_PROVIDER_SITE_OTHER): Payer: Medicare Other

## 2014-02-20 DIAGNOSIS — Z23 Encounter for immunization: Secondary | ICD-10-CM

## 2014-02-21 DIAGNOSIS — Z85828 Personal history of other malignant neoplasm of skin: Secondary | ICD-10-CM | POA: Diagnosis not present

## 2014-02-21 DIAGNOSIS — L298 Other pruritus: Secondary | ICD-10-CM | POA: Diagnosis not present

## 2014-02-21 DIAGNOSIS — L738 Other specified follicular disorders: Secondary | ICD-10-CM | POA: Diagnosis not present

## 2014-03-27 ENCOUNTER — Other Ambulatory Visit: Payer: Self-pay | Admitting: Family Medicine

## 2014-04-04 ENCOUNTER — Ambulatory Visit (INDEPENDENT_AMBULATORY_CARE_PROVIDER_SITE_OTHER): Payer: Medicare Other | Admitting: Family Medicine

## 2014-04-04 ENCOUNTER — Encounter: Payer: Self-pay | Admitting: Family Medicine

## 2014-04-04 VITALS — BP 140/70 | Temp 98.4°F | Ht 60.0 in | Wt 123.0 lb

## 2014-04-04 DIAGNOSIS — H6123 Impacted cerumen, bilateral: Secondary | ICD-10-CM

## 2014-04-04 DIAGNOSIS — M545 Low back pain, unspecified: Secondary | ICD-10-CM

## 2014-04-04 DIAGNOSIS — D179 Benign lipomatous neoplasm, unspecified: Secondary | ICD-10-CM | POA: Diagnosis not present

## 2014-04-04 LAB — POCT URINALYSIS DIPSTICK
Bilirubin, UA: NEGATIVE
Blood, UA: NEGATIVE
Glucose, UA: NEGATIVE
Ketones, UA: NEGATIVE
Spec Grav, UA: <=1.005
Urobilinogen, UA: 0.2
pH, UA: 7.5

## 2014-04-04 MED ORDER — METOPROLOL SUCCINATE ER 200 MG PO TB24: 200.0000 mg | ORAL_TABLET | Freq: Two times a day (BID) | ORAL | Status: DC

## 2014-04-04 MED ORDER — HYDROCHLOROTHIAZIDE 25 MG PO TABS: 12.5000 mg | ORAL_TABLET | Freq: Every day | ORAL | Status: DC

## 2014-04-04 NOTE — Addendum Note (Signed)
Addended by: Aggie Hacker A on: 04/04/2014 11:39 AM   Modules accepted: Orders

## 2014-04-04 NOTE — Progress Notes (Signed)
Pre visit review using our clinic review tool, if applicable. No additional management support is needed unless otherwise documented below in the visit note. 

## 2014-04-04 NOTE — Progress Notes (Signed)
   Subjective:    Patient ID: Linda Mays, female    DOB: [birth year], 73 y.o.   MRN: [id number]  HPI Here for several issues. First she has had some low back pains for 2 weeks. No recent trauma but she has been stooping a lot and climbing ladders decorating for Christmas. No UTI sx. Also she has had trouble hearing out of the left ear for a week. No ear pain. Lastly she has noticed a swelling over the top of her sternum for several weeks. There is no discomfort or trouble swallowing.    Review of Systems  Constitutional: Negative.   HENT: Positive for hearing loss. Negative for congestion, ear discharge, ear pain, postnasal drip and sinus pressure.   Eyes: Negative.   Gastrointestinal: Negative.   Genitourinary: Negative.   Musculoskeletal: Positive for back pain.       Objective:   Physical Exam  Constitutional: She appears well-developed and well-nourished. No distress.  HENT:  Nose: Nose normal.  Mouth/Throat: Oropharynx is clear and moist.  Both ear canals are full of cerumen   Eyes: Conjunctivae and EOM are normal. Pupils are equal, round, and reactive to light.  Neck: Normal range of motion. Neck supple. No tracheal deviation present. No thyromegaly present.  Cardiovascular: Normal rate, regular rhythm, normal heart sounds and intact distal pulses.   Pulmonary/Chest: Effort normal and breath sounds normal. No stridor.  Lymphadenopathy:    She has no cervical adenopathy.  Skin:  There is a small non-tender mobile firm mass just under the skin over the manubrium           Assessment & Plan:  Both ears were irrigated clear with water. She seems to have some musculoskeletal back pain and not a UTI. To be sure we will culture the urine sample. Try heat and Tylenol prn. She has a lipoma over the sternum. We will observe this and she will return if it starts to bother her.

## 2014-04-06 ENCOUNTER — Encounter: Payer: Self-pay | Admitting: Family Medicine

## 2014-04-06 ENCOUNTER — Ambulatory Visit (INDEPENDENT_AMBULATORY_CARE_PROVIDER_SITE_OTHER): Payer: Medicare Other | Admitting: Family Medicine

## 2014-04-06 VITALS — BP 133/75 | HR 69 | Temp 98.2°F | Ht 60.0 in | Wt 123.0 lb

## 2014-04-06 DIAGNOSIS — H6123 Impacted cerumen, bilateral: Secondary | ICD-10-CM | POA: Diagnosis not present

## 2014-04-06 DIAGNOSIS — H6122 Impacted cerumen, left ear: Secondary | ICD-10-CM | POA: Diagnosis not present

## 2014-04-06 DIAGNOSIS — H60543 Acute eczematoid otitis externa, bilateral: Secondary | ICD-10-CM | POA: Diagnosis not present

## 2014-04-06 LAB — URINE CULTURE
Colony Count: NO GROWTH
Organism ID, Bacteria: NO GROWTH

## 2014-04-06 NOTE — Progress Notes (Signed)
   Subjective:    Patient ID: Linda Mays, female    DOB: 1940/09/01, 73 y.o.   MRN: [id number]  HPI Here with pain in the left ear and loss of hearing. She was here 2 days ago for cerumen impactions in both ears and we irrigated them with water. The right side was cleared fairly easily but the left ear was very difficult to clear. The canal was irritated and showed a tiny bit of bleeding after we finished. Since then the ear has been uncomfortable and her hearing has worsened.    Review of Systems  Constitutional: Negative.   HENT: Positive for ear pain and hearing loss. Negative for ear discharge.        Objective:   Physical Exam  Constitutional: She appears well-developed and well-nourished.  HENT:  Right Ear: External ear normal.  Left ear canal is blocked with bloody cerumen   Neck: Neck supple.  Lymphadenopathy:    She has no cervical adenopathy.          Assessment & Plan:  We will refer her to ENT to have this ear cleaned out today.

## 2014-04-06 NOTE — Progress Notes (Signed)
Pre visit review using our clinic review tool, if applicable. No additional management support is needed unless otherwise documented below in the visit note. 

## 2014-04-14 ENCOUNTER — Other Ambulatory Visit: Payer: Self-pay | Admitting: Family Medicine

## 2014-04-16 ENCOUNTER — Telehealth: Payer: Self-pay | Admitting: Family Medicine

## 2014-04-16 NOTE — Telephone Encounter (Signed)
Sent to the pharmacy by e-scribe. 

## 2014-04-16 NOTE — Telephone Encounter (Signed)
Pt needs amlodipine #30 w/refills call into [facility],[place]. Pt has only 3 pills left

## 2014-04-17 NOTE — Telephone Encounter (Signed)
Script was sent e-scribe 

## 2014-05-01 ENCOUNTER — Other Ambulatory Visit: Payer: Self-pay | Admitting: Family Medicine

## 2014-05-01 MED ORDER — METOPROLOL SUCCINATE ER 200 MG PO TB24: 200.0000 mg | ORAL_TABLET | Freq: Two times a day (BID) | ORAL | Status: DC

## 2014-05-01 NOTE — Telephone Encounter (Signed)
Pt request refill metoprolol (TOPROL-XL) 200 MG 24 hr tablet [facility], but pt would like this med sent to [facility] [address] ,[place].  Pt states this is the one and [facility]. Pt has one tab left.  Thanks!

## 2014-05-01 NOTE — Telephone Encounter (Signed)
rx sent in electronically to [facility], [place]

## 2014-09-11 DIAGNOSIS — D1801 Hemangioma of skin and subcutaneous tissue: Secondary | ICD-10-CM | POA: Diagnosis not present

## 2014-09-11 DIAGNOSIS — L738 Other specified follicular disorders: Secondary | ICD-10-CM | POA: Diagnosis not present

## 2014-09-11 DIAGNOSIS — Z85828 Personal history of other malignant neoplasm of skin: Secondary | ICD-10-CM | POA: Diagnosis not present

## 2014-09-11 DIAGNOSIS — D2362 Other benign neoplasm of skin of left upper limb, including shoulder: Secondary | ICD-10-CM | POA: Diagnosis not present

## 2014-09-11 DIAGNOSIS — L298 Other pruritus: Secondary | ICD-10-CM | POA: Diagnosis not present

## 2014-09-11 DIAGNOSIS — L720 Epidermal cyst: Secondary | ICD-10-CM | POA: Diagnosis not present

## 2014-09-11 DIAGNOSIS — D2271 Melanocytic nevi of right lower limb, including hip: Secondary | ICD-10-CM | POA: Diagnosis not present

## 2014-09-11 DIAGNOSIS — L821 Other seborrheic keratosis: Secondary | ICD-10-CM | POA: Diagnosis not present

## 2014-10-04 ENCOUNTER — Encounter: Payer: Self-pay | Admitting: Family Medicine

## 2014-10-04 ENCOUNTER — Ambulatory Visit (INDEPENDENT_AMBULATORY_CARE_PROVIDER_SITE_OTHER): Payer: Medicare Other | Admitting: Family Medicine

## 2014-10-04 VITALS — BP 158/88 | HR 64 | Temp 98.8°F | Wt 120.3 lb

## 2014-10-04 DIAGNOSIS — I10 Essential (primary) hypertension: Secondary | ICD-10-CM

## 2014-10-04 MED ORDER — AMLODIPINE BESYLATE 5 MG PO TABS: 5.0000 mg | ORAL_TABLET | Freq: Every day | ORAL | Status: DC

## 2014-10-04 MED ORDER — LOSARTAN POTASSIUM 50 MG PO TABS: 50.0000 mg | ORAL_TABLET | Freq: Every day | ORAL | Status: DC

## 2014-10-04 MED ORDER — SYNTHROID 50 MCG PO TABS: 50.0000 ug | ORAL_TABLET | Freq: Every day | ORAL | Status: DC

## 2014-10-04 NOTE — Progress Notes (Signed)
   Subjective:    Patient ID: Linda Mays, female    DOB: 11/28/1940, 74 y.o.   MRN: [id number]  HPI Here with concerns over her BP. At home over the past month her BP has risen to the 130-150 over 80-90 range. She feels well except on a few occasions she has felt lightheaded or shaky in the early morning hours, and this seems to correlate to her BP going up. No HA or chest pain or SOB. She has stopped her HCTZ.    Review of Systems  Constitutional: Negative.   Respiratory: Negative.   Cardiovascular: Negative.        Objective:   Physical Exam  Constitutional: She appears well-developed and well-nourished.  Cardiovascular: Normal rate, regular rhythm, normal heart sounds and intact distal pulses.   Pulmonary/Chest: Effort normal and breath sounds normal.  Musculoskeletal: She exhibits no edema.          Assessment & Plan:  Her HTN has worsened a bit. She will stay on Metoprolol bid. I asked her to switch her dosing of Amlodipine to the evenings rather than mornings, and I hope this will blunt the early am spikes she has been seeing. Add Losartan 50 mg each morning. Recheck in 2 weeks

## 2015-01-17 DIAGNOSIS — H6123 Impacted cerumen, bilateral: Secondary | ICD-10-CM | POA: Diagnosis not present

## 2015-01-29 ENCOUNTER — Other Ambulatory Visit: Payer: Self-pay | Admitting: Family Medicine

## 2015-01-30 ENCOUNTER — Telehealth: Payer: Self-pay | Admitting: Family Medicine

## 2015-01-30 ENCOUNTER — Ambulatory Visit (INDEPENDENT_AMBULATORY_CARE_PROVIDER_SITE_OTHER): Payer: Medicare Other | Admitting: Family Medicine

## 2015-01-30 DIAGNOSIS — Z23 Encounter for immunization: Secondary | ICD-10-CM | POA: Diagnosis not present

## 2015-01-30 NOTE — Telephone Encounter (Signed)
Pt needs refill on losartan 50 mg #30 with refills send to [facility]

## 2015-01-31 NOTE — Telephone Encounter (Signed)
Script was sent e-scribe to [facility] and I spoke with pt.

## 2015-02-08 ENCOUNTER — Ambulatory Visit: Payer: Medicare Other

## 2015-02-14 DIAGNOSIS — Z1231 Encounter for screening mammogram for malignant neoplasm of breast: Secondary | ICD-10-CM | POA: Diagnosis not present

## 2015-02-14 LAB — HM MAMMOGRAPHY

## 2015-02-18 ENCOUNTER — Encounter: Payer: Self-pay | Admitting: Family Medicine

## 2015-05-27 ENCOUNTER — Other Ambulatory Visit: Payer: Self-pay | Admitting: Family Medicine

## 2015-05-28 NOTE — Telephone Encounter (Signed)
Patient said the pharmacy has not received an ok to refill the Losartan and she only has a few pills left.

## 2015-05-30 ENCOUNTER — Ambulatory Visit (INDEPENDENT_AMBULATORY_CARE_PROVIDER_SITE_OTHER): Payer: Medicare Other | Admitting: Family Medicine

## 2015-05-30 ENCOUNTER — Encounter: Payer: Self-pay | Admitting: Family Medicine

## 2015-05-30 VITALS — BP 126/61 | HR 50 | Temp 98.5°F | Ht 60.0 in | Wt 122.0 lb

## 2015-05-30 DIAGNOSIS — R309 Painful micturition, unspecified: Secondary | ICD-10-CM

## 2015-05-30 DIAGNOSIS — N390 Urinary tract infection, site not specified: Secondary | ICD-10-CM

## 2015-05-30 LAB — POCT URINALYSIS DIPSTICK
Bilirubin, UA: NEGATIVE
Glucose, UA: NEGATIVE
Ketones, UA: NEGATIVE
Nitrite, UA: NEGATIVE
Protein, UA: NEGATIVE
Spec Grav, UA: 1.015
Urobilinogen, UA: 0.2
pH, UA: 6

## 2015-05-30 MED ORDER — CIPROFLOXACIN HCL 500 MG PO TABS: 500.0000 mg | ORAL_TABLET | Freq: Two times a day (BID) | ORAL | Status: DC

## 2015-05-30 MED ORDER — METOPROLOL SUCCINATE ER 200 MG PO TB24: 100.0000 mg | ORAL_TABLET | Freq: Two times a day (BID) | ORAL | Status: DC

## 2015-05-30 NOTE — Progress Notes (Signed)
Pre visit review using our clinic review tool, if applicable. No additional management support is needed unless otherwise documented below in the visit note. 

## 2015-05-30 NOTE — Progress Notes (Signed)
   Subjective:    Patient ID: Linda Mays, female    DOB: 11-May-1940, 75 y.o.   MRN: [id number]  HPI Here for one week of urinary urgency and burning. No fever.    Review of Systems  Constitutional: Negative.   Genitourinary: Positive for dysuria, urgency and frequency. Negative for hematuria, flank pain and pelvic pain.       Objective:   Physical Exam  Constitutional: She appears well-developed and well-nourished.  Cardiovascular: Normal rate, regular rhythm, normal heart sounds and intact distal pulses.   Pulmonary/Chest: Effort normal and breath sounds normal.  Abdominal: Soft. Bowel sounds are normal. She exhibits no distension and no mass. There is no tenderness. There is no rebound and no guarding.          Assessment & Plan:  UTI, treat with Cipro. Drink plenty of water. Culture the sample

## 2015-06-02 LAB — URINE CULTURE: Colony Count: >=100000

## 2015-06-26 ENCOUNTER — Encounter: Payer: Self-pay | Admitting: Family Medicine

## 2015-06-26 ENCOUNTER — Ambulatory Visit (INDEPENDENT_AMBULATORY_CARE_PROVIDER_SITE_OTHER): Payer: Medicare Other | Admitting: Family Medicine

## 2015-06-26 VITALS — BP 118/64 | Temp 98.3°F | Wt 123.0 lb

## 2015-06-26 DIAGNOSIS — N390 Urinary tract infection, site not specified: Secondary | ICD-10-CM

## 2015-06-26 LAB — POC URINALSYSI DIPSTICK (AUTOMATED)
Bilirubin, UA: NEGATIVE
Blood, UA: NEGATIVE
Glucose, UA: NEGATIVE
Ketones, UA: NEGATIVE
Nitrite, UA: NEGATIVE
Protein, UA: NEGATIVE
Spec Grav, UA: <=1.005
Urobilinogen, UA: 0.2
pH, UA: 7

## 2015-06-26 MED ORDER — METOPROLOL SUCCINATE ER 100 MG PO TB24: 100.0000 mg | ORAL_TABLET | Freq: Two times a day (BID) | ORAL | Status: DC

## 2015-06-26 MED ORDER — LOSARTAN POTASSIUM 50 MG PO TABS: 50.0000 mg | ORAL_TABLET | Freq: Every day | ORAL | Status: DC

## 2015-06-26 MED ORDER — CIPROFLOXACIN HCL 500 MG PO TABS: 500.0000 mg | ORAL_TABLET | Freq: Two times a day (BID) | ORAL | Status: DC

## 2015-06-26 MED ORDER — SYNTHROID 50 MCG PO TABS: 50.0000 ug | ORAL_TABLET | Freq: Every day | ORAL | Status: DC

## 2015-06-26 NOTE — Addendum Note (Signed)
Addended by: Aggie Hacker A on: 06/26/2015 10:06 AM   Modules accepted: Orders

## 2015-06-26 NOTE — Progress Notes (Signed)
   Subjective:    Patient ID: Linda Mays, female    DOB: 1940/08/10, 75 y.o.   MRN: [id number]  HPI Here for a recurrence of UTI symptoms including urgency to urinate and pelvic pressure. No burning or fever. She was treated one month ago for a Klebsiella UTI with 7 days of Cipro, and her symptoms promptly went away. Now for the past 3 days they are back. She tries to drink plenty of water.    Review of Systems  Constitutional: Negative.   Respiratory: Negative.   Cardiovascular: Negative.   Gastrointestinal: Negative.   Genitourinary: Positive for urgency and frequency. Negative for dysuria, hematuria and flank pain.       Objective:   Physical Exam  Constitutional: She appears well-developed and well-nourished.  Abdominal: Soft. Bowel sounds are normal. She exhibits no distension and no mass. There is no tenderness. There is no rebound and no guarding.          Assessment & Plan:  Recurrent UTI. We will culture today's sample. Treat with 14 days of Cipro.

## 2015-06-28 LAB — URINE CULTURE
Colony Count: NO GROWTH
Organism ID, Bacteria: NO GROWTH

## 2015-07-11 ENCOUNTER — Other Ambulatory Visit: Payer: Self-pay | Admitting: Family Medicine

## 2015-07-11 ENCOUNTER — Other Ambulatory Visit: Payer: Self-pay | Admitting: General Practice

## 2015-07-11 ENCOUNTER — Telehealth: Payer: Self-pay | Admitting: General Practice

## 2015-07-11 NOTE — Telephone Encounter (Signed)
[facility].

## 2015-07-12 ENCOUNTER — Telehealth: Payer: Self-pay | Admitting: Family Medicine

## 2015-07-12 NOTE — Telephone Encounter (Signed)
Per Dr. Sarajane Jews pt should be taking metoprolol 100 mg bid. I left a voice message with this information for pt and called pharmacy to cancel previous order, also updated chart.

## 2015-08-20 DIAGNOSIS — H3342 Traction detachment of retina, left eye: Secondary | ICD-10-CM | POA: Diagnosis not present

## 2015-09-26 DIAGNOSIS — Z85828 Personal history of other malignant neoplasm of skin: Secondary | ICD-10-CM | POA: Diagnosis not present

## 2015-09-26 DIAGNOSIS — D2272 Melanocytic nevi of left lower limb, including hip: Secondary | ICD-10-CM | POA: Diagnosis not present

## 2015-09-26 DIAGNOSIS — L738 Other specified follicular disorders: Secondary | ICD-10-CM | POA: Diagnosis not present

## 2015-09-26 DIAGNOSIS — L700 Acne vulgaris: Secondary | ICD-10-CM | POA: Diagnosis not present

## 2015-09-26 DIAGNOSIS — D1801 Hemangioma of skin and subcutaneous tissue: Secondary | ICD-10-CM | POA: Diagnosis not present

## 2015-09-26 DIAGNOSIS — D225 Melanocytic nevi of trunk: Secondary | ICD-10-CM | POA: Diagnosis not present

## 2015-09-26 DIAGNOSIS — L308 Other specified dermatitis: Secondary | ICD-10-CM | POA: Diagnosis not present

## 2015-09-26 DIAGNOSIS — D2262 Melanocytic nevi of left upper limb, including shoulder: Secondary | ICD-10-CM | POA: Diagnosis not present

## 2015-09-26 DIAGNOSIS — L720 Epidermal cyst: Secondary | ICD-10-CM | POA: Diagnosis not present

## 2015-10-22 DIAGNOSIS — H25813 Combined forms of age-related cataract, bilateral: Secondary | ICD-10-CM | POA: Diagnosis not present

## 2016-01-14 ENCOUNTER — Encounter: Payer: Self-pay | Admitting: Family Medicine

## 2016-01-14 ENCOUNTER — Ambulatory Visit (INDEPENDENT_AMBULATORY_CARE_PROVIDER_SITE_OTHER): Payer: Medicare Other | Admitting: Family Medicine

## 2016-01-14 VITALS — BP 136/84 | Temp 98.1°F | Ht 60.0 in | Wt 121.0 lb

## 2016-01-14 DIAGNOSIS — I10 Essential (primary) hypertension: Secondary | ICD-10-CM | POA: Diagnosis not present

## 2016-01-14 DIAGNOSIS — R739 Hyperglycemia, unspecified: Secondary | ICD-10-CM

## 2016-01-14 DIAGNOSIS — E538 Deficiency of other specified B group vitamins: Secondary | ICD-10-CM

## 2016-01-14 DIAGNOSIS — G629 Polyneuropathy, unspecified: Secondary | ICD-10-CM | POA: Diagnosis not present

## 2016-01-14 DIAGNOSIS — M545 Low back pain, unspecified: Secondary | ICD-10-CM | POA: Insufficient documentation

## 2016-01-14 DIAGNOSIS — R350 Frequency of micturition: Secondary | ICD-10-CM | POA: Diagnosis not present

## 2016-01-14 DIAGNOSIS — E039 Hypothyroidism, unspecified: Secondary | ICD-10-CM

## 2016-01-14 LAB — POC URINALSYSI DIPSTICK (AUTOMATED)
Bilirubin, UA: NEGATIVE
Blood, UA: NEGATIVE
Glucose, UA: NEGATIVE
Ketones, UA: NEGATIVE
Leukocytes, UA: NEGATIVE
Nitrite, UA: NEGATIVE
Protein, UA: NEGATIVE
Spec Grav, UA: 1.01
Urobilinogen, UA: 0.2
pH, UA: 6

## 2016-01-14 LAB — CBC WITH DIFFERENTIAL/PLATELET
Basophils Absolute: 0 10*3/uL (ref 0.0–0.1)
Basophils Relative: 0.3 % (ref 0.0–3.0)
Eosinophils Absolute: 0.2 10*3/uL (ref 0.0–0.7)
Eosinophils Relative: 3.2 % (ref 0.0–5.0)
HCT: 40.5 % (ref 36.0–46.0)
Hemoglobin: 13.9 g/dL (ref 12.0–15.0)
Lymphocytes Relative: 47 % — ABNORMAL HIGH (ref 12.0–46.0)
Lymphs Abs: 2.4 10*3/uL (ref 0.7–4.0)
MCHC: 34.5 g/dL (ref 30.0–36.0)
MCV: 90.3 fl (ref 78.0–100.0)
Monocytes Absolute: 0.5 10*3/uL (ref 0.1–1.0)
Monocytes Relative: 10.7 % (ref 3.0–12.0)
Neutro Abs: 2 10*3/uL (ref 1.4–7.7)
Neutrophils Relative %: 38.8 % — ABNORMAL LOW (ref 43.0–77.0)
Platelets: 196 10*3/uL (ref 150.0–400.0)
RBC: 4.48 Mil/uL (ref 3.87–5.11)
RDW: 12.3 % (ref 11.5–15.5)
WBC: 5 10*3/uL (ref 4.0–10.5)

## 2016-01-14 LAB — BASIC METABOLIC PANEL
BUN: 16 mg/dL (ref 6–23)
CO2: 29 mEq/L (ref 19–32)
Calcium: 9.4 mg/dL (ref 8.4–10.5)
Chloride: 106 mEq/L (ref 96–112)
Creatinine, Ser: 0.81 mg/dL (ref 0.40–1.20)
GFR: 72.84 mL/min (ref >60.00)
Glucose, Bld: 86 mg/dL (ref 70–99)
Potassium: 4.4 mEq/L (ref 3.5–5.1)
Sodium: 141 mEq/L (ref 135–145)

## 2016-01-14 LAB — TSH: TSH: 3.76 u[IU]/mL (ref 0.35–4.50)

## 2016-01-14 LAB — HEPATIC FUNCTION PANEL
ALT: 18 U/L (ref 0–35)
AST: 18 U/L (ref 0–37)
Albumin: 4.3 g/dL (ref 3.5–5.2)
Alkaline Phosphatase: 78 U/L (ref 39–117)
Bilirubin, Direct: 0.1 mg/dL (ref 0.0–0.3)
Total Bilirubin: 0.5 mg/dL (ref 0.2–1.2)
Total Protein: 6.9 g/dL (ref 6.0–8.3)

## 2016-01-14 LAB — HEMOGLOBIN A1C: Hgb A1c MFr Bld: 5.4 % (ref 4.6–6.5)

## 2016-01-14 LAB — VITAMIN B12: Vitamin B-12: 422 pg/mL (ref 211–911)

## 2016-01-14 NOTE — Progress Notes (Signed)
   Subjective:    Patient ID: Linda Mays, female    DOB: 1940/08/03, 75 y.o.   MRN: [id number]  HPI Here for several issues. First over the past month she has had intermittent mild burning pains in both lower legs and both feet. Sometimes they tingle as well, but no numbness. No swelling. No symptoms in the hands. Also for the past few days she has had a mild aching pain in the lower back. No recent falls or trauma. No urinary burning or urgency. BMs are normal.    Review of Systems  Constitutional: Negative.   Respiratory: Negative.   Cardiovascular: Negative.   Gastrointestinal: Negative.   Genitourinary: Negative.   Musculoskeletal: Positive for back pain.  Neurological: Negative for weakness and numbness.       Objective:   Physical Exam  Constitutional: She is oriented to person, place, and time. She appears well-developed and well-nourished.  Neck: No thyromegaly present.  Cardiovascular: Normal rate, regular rhythm, normal heart sounds and intact distal pulses.   Pulmonary/Chest: Effort normal and breath sounds normal.  Musculoskeletal: She exhibits no edema.  Lymphadenopathy:    She has no cervical adenopathy.  Neurological: She is alert and oriented to person, place, and time.          Assessment & Plan:  Her back pain seems to be originating in the spine, and she probably has some early osteoarthritis developing there. Use Ibuprofen prn. She does not show any UTI. She also has neuropathy in the feet. We will get labs to try to find an etiology. She already takes a Centrum multi-vitamin daily.  Laurey Morale, MD

## 2016-01-14 NOTE — Progress Notes (Signed)
Pre visit review using our clinic review tool, if applicable. No additional management support is needed unless otherwise documented below in the visit note. 

## 2016-01-14 NOTE — Addendum Note (Signed)
Addended by: Aggie Hacker A on: 01/14/2016 02:49 PM   Modules accepted: Orders

## 2016-02-11 ENCOUNTER — Ambulatory Visit (INDEPENDENT_AMBULATORY_CARE_PROVIDER_SITE_OTHER): Payer: Medicare Other | Admitting: Family Medicine

## 2016-02-11 DIAGNOSIS — Z23 Encounter for immunization: Secondary | ICD-10-CM

## 2016-03-02 DIAGNOSIS — J3089 Other allergic rhinitis: Secondary | ICD-10-CM | POA: Diagnosis not present

## 2016-03-02 DIAGNOSIS — H6123 Impacted cerumen, bilateral: Secondary | ICD-10-CM | POA: Diagnosis not present

## 2016-06-11 DIAGNOSIS — H6123 Impacted cerumen, bilateral: Secondary | ICD-10-CM | POA: Diagnosis not present

## 2016-06-19 ENCOUNTER — Other Ambulatory Visit: Payer: Self-pay | Admitting: Family Medicine

## 2016-06-19 MED ORDER — METOPROLOL SUCCINATE ER 100 MG PO TB24: 100.0000 mg | ORAL_TABLET | Freq: Two times a day (BID) | ORAL | 3 refills | Status: DC

## 2016-06-19 MED ORDER — LOSARTAN POTASSIUM 50 MG PO TABS: 50.0000 mg | ORAL_TABLET | Freq: Every day | ORAL | 3 refills | Status: DC

## 2016-06-19 MED ORDER — SYNTHROID 50 MCG PO TABS: 50.0000 ug | ORAL_TABLET | Freq: Every day | ORAL | 3 refills | Status: DC

## 2016-06-19 NOTE — Telephone Encounter (Signed)
Rxs refilled. Nothing further needed.   

## 2016-06-19 NOTE — Telephone Encounter (Signed)
Pt need new Rx losartan,metoprolol succinate and SYNTHROID.  Pharm:  [facility] ([address])

## 2016-10-29 ENCOUNTER — Encounter: Payer: Self-pay | Admitting: Family Medicine

## 2016-10-29 ENCOUNTER — Ambulatory Visit (INDEPENDENT_AMBULATORY_CARE_PROVIDER_SITE_OTHER): Payer: Medicare Other | Admitting: Family Medicine

## 2016-10-29 VITALS — BP 152/94 | HR 74 | Temp 98.0°F | Wt 123.8 lb

## 2016-10-29 DIAGNOSIS — I10 Essential (primary) hypertension: Secondary | ICD-10-CM | POA: Diagnosis not present

## 2016-10-29 NOTE — Progress Notes (Signed)
Subjective:    Patient ID: Linda Mays, female    DOB: 1941/01/24, 76 y.o.   MRN: [id number]  HPI  Linda Mays is a 76 year old female who presents today for elevated blood pressure I am seeing her in place of her PCP who is unavailable at this time.  She  has a past medical history of Dyslipidemia; GERD (gastroesophageal reflux disease); History of colonic polyps; Hypertension; Hypothyroidism; Osteoarthritis; Osteoporosis; and Routine gynecological examination.   Monitor BP at home BID and she reports systolic averages in the 010X and diastolic ranges in the 32T.  She took her BP yesterday around 9pm and she noted an elevated reading of 189/111. Following this reading she took an additional metoprolol that improved following this additional dose. Prior treatment included metoprolol 200 mg which caused BP to go "too low".  Adherence to medications:  She is taking metoprolol 100 mg BID and losartan 50 mg daily without adverse effects. She did take metoprolol approximately one hour prior to this reading. Diet: She does not follow a particular diet, however she did not eat very "salty" foods yesterday Exercise: She does not follow a particular exercise program She denies being outside for extended period of times. She denies drinking "a lot" of water and is trying to increase this daily. She denies chest pain, palpitations, SOB, numbness, tingling, weakness, headaches, jaw pain/arm pain, N/V, diaphoresis, stress, or edema.   Review of Systems  Constitutional: Negative for chills, fatigue and fever.  Respiratory: Negative for cough, shortness of breath and wheezing.   Cardiovascular: Negative for chest pain and palpitations.  Gastrointestinal: Negative for abdominal pain, constipation, diarrhea, nausea and vomiting.  Genitourinary: Negative for dysuria.  Musculoskeletal: Negative for myalgias.  Neurological: Negative for dizziness, weakness, light-headedness and headaches.    Psychiatric/Behavioral:       Denies depressed or anxious mood   Past Medical History:  Diagnosis Date  . Dyslipidemia   . GERD (gastroesophageal reflux disease)   . History of colonic polyps   . Hypertension   . Hypothyroidism   . Osteoarthritis   . Osteoporosis   . Routine gynecological examination    sees Dr. Dellis Filbert      Social History   Social History  . Marital status: Divorced    Spouse name: N/A  . Number of children: 2  . Years of education: N/A   Occupational History  . Retired Retired   Social History Main Topics  . Smoking status: Never Smoker  . Smokeless tobacco: Never Used  . Alcohol use No  . Drug use: No  . Sexual activity: Not on file   Other Topics Concern  . Not on file   Social History Narrative   Lives at home by herself    Past Surgical History:  Procedure Laterality Date  . ABDOMINAL HYSTERECTOMY    . Bladder tack procedure    . BREAST LUMPECTOMY    . OOPHORECTOMY      Family History  Problem Relation Age of Onset  . Colon cancer Other        1st degree relative<60  . Diabetes Other        1st degree relative  . Hypertension Other   . Prostate cancer Other        1st degree relative <50  . Diverticulitis Mother   . CAD Father 62    Allergies  Allergen Reactions  . Lisinopril-Hydrochlorothiazide Other (See Comments)    Hot flashes  . Ace Inhibitors  angioedema  . Doxycycline   . Iophen C-Nr [Guaifenesin-Codeine]   . Penicillins   . Sulfonamide Derivatives     Current Outpatient Prescriptions on File Prior to Visit  Medication Sig Dispense Refill  . losartan (COZAAR) 50 MG tablet Take 1 tablet (50 mg total) by mouth daily. 90 tablet 3  . metoprolol succinate (TOPROL-XL) 100 MG 24 hr tablet Take 1 tablet (100 mg total) by mouth 2 (two) times daily. Take with or immediately following a meal. 180 tablet 3  . SYNTHROID 50 MCG tablet Take 1 tablet (50 mcg total) by mouth daily. 90 tablet 3   No current  facility-administered medications on file prior to visit.     BP (!) 160/96 (BP Location: Left Arm, Patient Position: Sitting, Cuff Size: Normal)   Pulse 74   Temp 98 F (36.7 C) (Oral)   Wt 123 lb 12.8 oz (56.2 kg)   SpO2 97%   BMI 24.18 kg/m       Objective:   Physical Exam  Constitutional: She is oriented to person, place, and time. She appears well-developed and well-nourished.  Eyes: Pupils are equal, round, and reactive to light. No scleral icterus.  Neck: Neck supple.  Cardiovascular: Normal rate, regular rhythm and intact distal pulses.   Pulmonary/Chest: Effort normal and breath sounds normal. She has no wheezes. She has no rales.  Abdominal: Soft. Bowel sounds are normal. There is no tenderness.  Lymphadenopathy:    She has no cervical adenopathy.  Neurological: She is alert and oriented to person, place, and time.  Skin: Skin is warm and dry. No rash noted.  Psychiatric: She has a normal mood and affect. Her behavior is normal. Judgment and thought content normal.       Assessment & Plan:  1. Essential hypertension Retake of BP 152/94; patient feels well today; prior increase of metoprolol caused BP to go too low; changing metoprolol dose is concerning to her as she had BP go "too low" last time;  will increase losartan to 100 mg daily; advised continued monitoring of BP, document readings, and follow up with Dr. Sarajane Jews in 2 weeks or sooner if needed.  Further reviewed the importance of monitoring salt in her diet and provided DASH dietary recommendations.  Delano Metz, FNP-C

## 2016-10-29 NOTE — Patient Instructions (Addendum)
It was a pleasure to see you today. Please increase your losartan dose from 50 mg to 100 mg daily as discussed; monitor your blood pressure for 2 weeks and follow up with Dr. Sarajane Jews for further evaluation and management. If BP remains elevated or you are having low blood pressure, follow up sooner than 2 weeks.   DASH Eating Plan DASH stands for "Dietary Approaches to Stop Hypertension." The DASH eating plan is a healthy eating plan that has been shown to reduce high blood pressure (hypertension). It may also reduce your risk for type 2 diabetes, heart disease, and stroke. The DASH eating plan may also help with weight loss. What are tips for following this plan? General guidelines  Avoid eating more than 2,300 mg (milligrams) of salt (sodium) a day. If you have hypertension, you may need to reduce your sodium intake to 1,500 mg a day.  Limit alcohol intake to no more than 1 drink a day for nonpregnant women and 2 drinks a day for men. One drink equals 12 oz of beer, 5 oz of wine, or 1 oz of hard liquor.  Work with your health care provider to maintain a healthy body weight or to lose weight. Ask what an ideal weight is for you.  Get at least 30 minutes of exercise that causes your heart to beat faster (aerobic exercise) most days of the week. Activities may include walking, swimming, or biking.  Work with your health care provider or diet and nutrition specialist (dietitian) to adjust your eating plan to your individual calorie needs. Reading food labels  Check food labels for the amount of sodium per serving. Choose foods with less than 5 percent of the Daily Value of sodium. Generally, foods with less than 300 mg of sodium per serving fit into this eating plan.  To find whole grains, look for the word "whole" as the first word in the ingredient list. Shopping  Buy products labeled as "low-sodium" or "no salt added."  Buy fresh foods. Avoid canned foods and premade or frozen  meals. Cooking  Avoid adding salt when cooking. Use salt-free seasonings or herbs instead of table salt or sea salt. Check with your health care provider or pharmacist before using salt substitutes.  Do not fry foods. Cook foods using healthy methods such as baking, boiling, grilling, and broiling instead.  Cook with heart-healthy oils, such as olive, canola, soybean, or sunflower oil. Meal planning   Eat a balanced diet that includes: ? 5 or more servings of fruits and vegetables each day. At each meal, try to fill half of your plate with fruits and vegetables. ? Up to 6-8 servings of whole grains each day. ? Less than 6 oz of lean meat, poultry, or fish each day. A 3-oz serving of meat is about the same size as a deck of cards. One egg equals 1 oz. ? 2 servings of low-fat dairy each day. ? A serving of nuts, seeds, or beans 5 times each week. ? Heart-healthy fats. Healthy fats called Omega-3 fatty acids are found in foods such as flaxseeds and coldwater fish, like sardines, salmon, and mackerel.  Limit how much you eat of the following: ? Canned or prepackaged foods. ? Food that is high in trans fat, such as fried foods. ? Food that is high in saturated fat, such as fatty meat. ? Sweets, desserts, sugary drinks, and other foods with added sugar. ? Full-fat dairy products.  Do not salt foods before eating.  Try to  eat at least 2 vegetarian meals each week.  Eat more home-cooked food and less restaurant, buffet, and fast food.  When eating at a restaurant, ask that your food be prepared with less salt or no salt, if possible. What foods are recommended? The items listed may not be a complete list. Talk with your dietitian about what dietary choices are best for you. Grains Whole-grain or whole-wheat bread. Whole-grain or whole-wheat pasta. Brown rice. [place]. Bulgur. Whole-grain and low-sodium cereals. Pita bread. Low-fat, low-sodium crackers. Whole-wheat flour  tortillas. Vegetables Fresh or frozen vegetables (raw, steamed, roasted, or grilled). Low-sodium or reduced-sodium tomato and vegetable juice. Low-sodium or reduced-sodium tomato sauce and tomato paste. Low-sodium or reduced-sodium canned vegetables. Fruits All fresh, dried, or frozen fruit. Canned fruit in natural juice (without added sugar). Meat and other protein foods Skinless chicken or [place]. Ground chicken or [place]. Pork with fat trimmed off. Fish and seafood. Egg whites. Dried beans, peas, or lentils. Unsalted nuts, nut butters, and seeds. Unsalted canned beans. Lean cuts of beef with fat trimmed off. Low-sodium, lean deli meat. Dairy Low-fat (1%) or fat-free (skim) milk. Fat-free, low-fat, or reduced-fat cheeses. Nonfat, low-sodium ricotta or cottage cheese. Low-fat or nonfat yogurt. Low-fat, low-sodium cheese. Fats and oils Soft margarine without trans fats. Vegetable oil. Low-fat, reduced-fat, or light mayonnaise and salad dressings (reduced-sodium). Canola, safflower, olive, soybean, and sunflower oils. Avocado. Seasoning and other foods Herbs. Spices. Seasoning mixes without salt. Unsalted popcorn and pretzels. Fat-free sweets. What foods are not recommended? The items listed may not be a complete list. Talk with your dietitian about what dietary choices are best for you. Grains Baked goods made with fat, such as croissants, muffins, or some breads. Dry pasta or rice meal packs. Vegetables Creamed or fried vegetables. Vegetables in a cheese sauce. Regular canned vegetables (not low-sodium or reduced-sodium). Regular canned tomato sauce and paste (not low-sodium or reduced-sodium). Regular tomato and vegetable juice (not low-sodium or reduced-sodium). Angie Fava. Olives. Fruits Canned fruit in a light or heavy syrup. Fried fruit. Fruit in cream or butter sauce. Meat and other protein foods Fatty cuts of meat. Ribs. Fried meat. Berniece Salines. Sausage. Bologna and other processed lunch meats.  Salami. Fatback. Hotdogs. Bratwurst. Salted nuts and seeds. Canned beans with added salt. Canned or smoked fish. Whole eggs or egg yolks. Chicken or [place] with skin. Dairy Whole or 2% milk, cream, and half-and-half. Whole or full-fat cream cheese. Whole-fat or sweetened yogurt. Full-fat cheese. Nondairy creamers. Whipped toppings. Processed cheese and cheese spreads. Fats and oils Butter. Stick margarine. Lard. Shortening. Ghee. Bacon fat. Tropical oils, such as coconut, palm kernel, or palm oil. Seasoning and other foods Salted popcorn and pretzels. Onion salt, garlic salt, seasoned salt, table salt, and sea salt. Worcestershire sauce. Tartar sauce. Barbecue sauce. Teriyaki sauce. Soy sauce, including reduced-sodium. Steak sauce. Canned and packaged gravies. Fish sauce. Oyster sauce. Cocktail sauce. Horseradish that you find on the shelf. Ketchup. Mustard. Meat flavorings and tenderizers. Bouillon cubes. Hot sauce and Tabasco sauce. Premade or packaged marinades. Premade or packaged taco seasonings. Relishes. Regular salad dressings. Where to find more information:  National Heart, Lung, and [place]: https://wilson-eaton.com/  American Heart Association: www.heart.org Summary  The DASH eating plan is a healthy eating plan that has been shown to reduce high blood pressure (hypertension). It may also reduce your risk for type 2 diabetes, heart disease, and stroke.  With the DASH eating plan, you should limit salt (sodium) intake to 2,300 mg a day. If you have  hypertension, you may need to reduce your sodium intake to 1,500 mg a day.  When on the DASH eating plan, aim to eat more fresh fruits and vegetables, whole grains, lean proteins, low-fat dairy, and heart-healthy fats.  Work with your health care provider or diet and nutrition specialist (dietitian) to adjust your eating plan to your individual calorie needs. This information is not intended to replace advice given to you by your health  care provider. Make sure you discuss any questions you have with your health care provider. Document Released: 04/02/2011 Document Revised: 04/06/2016 Document Reviewed: 04/06/2016 Elsevier Interactive Patient Education  2017 [employer].  How to Take Your Blood Pressure You can take your blood pressure at home with a machine. You may need to check your blood pressure at home:  To check if you have high blood pressure (hypertension).  To check your blood pressure over time.  To make sure your blood pressure medicine is working.  Supplies needed: You will need a blood pressure machine, or monitor. You can buy one at a drugstore or online. When choosing one:  Choose one with an arm cuff.  Choose one that wraps around your upper arm. Only one finger should fit between your arm and the cuff.  Do not choose one that measures your blood pressure from your wrist or finger.  Your doctor can suggest a monitor. How to prepare Avoid these things for 30 minutes before checking your blood pressure:  Drinking caffeine.  Drinking alcohol.  Eating.  Smoking.  Exercising.  Five minutes before checking your blood pressure:  Pee.  Sit in a dining chair. Avoid sitting in a soft couch or armchair.  Be quiet. Do not talk.  How to take your blood pressure Follow the instructions that came with your machine. If you have a digital blood pressure monitor, these may be the instructions: 1. Sit up straight. 2. Place your feet on the floor. Do not cross your ankles or legs. 3. Rest your left arm at the level of your heart. You may rest it on a table, desk, or chair. 4. Pull up your shirt sleeve. 5. Wrap the blood pressure cuff around the upper part of your left arm. The cuff should be 1 inch (2.5 cm) above your elbow. It is best to wrap the cuff around bare skin. 6. Fit the cuff snugly around your arm. You should be able to place only one finger between the cuff and your arm. 7. Put the cord  inside the groove of your elbow. 8. Press the power button. 9. Sit quietly while the cuff fills with air and loses air. 10. Write down the numbers on the screen. 11. Wait 2-3 minutes and then repeat steps 1-10.  What do the numbers mean? Two numbers make up your blood pressure. The first number is called systolic pressure. The second is called diastolic pressure. An example of a blood pressure reading is "120 over 80" (or 120/80). If you are an adult and do not have a medical condition, use this guide to find out if your blood pressure is normal: Normal  First number: below 120.  Second number: below 80. Elevated  First number: 120-129.  Second number: below 80. Hypertension stage 1  First number: 130-139.  Second number: 80-89. Hypertension stage 2  First number: 140 or above.  Second number: 40 or above. Your blood pressure is above normal even if only the top or bottom number is above normal. Follow these instructions at  home:  Check your blood pressure as often as your doctor tells you to.  Take your monitor to your next doctor's appointment. Your doctor will: ? Make sure you are using it correctly. ? Make sure it is working right.  Make sure you understand what your blood pressure numbers should be.  Tell your doctor if your medicines are causing side effects. Contact a doctor if:  Your blood pressure keeps being high. Get help right away if:  Your first blood pressure number is higher than 180.  Your second blood pressure number is higher than 120. This information is not intended to replace advice given to you by your health care provider. Make sure you discuss any questions you have with your health care provider. Document Released: 03/26/2008 Document Revised: 03/11/2016 Document Reviewed: 09/20/2015 Elsevier Interactive Patient Education  [employer].

## 2016-11-12 ENCOUNTER — Encounter: Payer: Self-pay | Admitting: Family Medicine

## 2016-11-12 ENCOUNTER — Ambulatory Visit (INDEPENDENT_AMBULATORY_CARE_PROVIDER_SITE_OTHER): Payer: Medicare Other | Admitting: Family Medicine

## 2016-11-12 VITALS — BP 138/87 | HR 67 | Temp 98.1°F | Ht 60.0 in | Wt 123.0 lb

## 2016-11-12 DIAGNOSIS — I10 Essential (primary) hypertension: Secondary | ICD-10-CM | POA: Diagnosis not present

## 2016-11-12 MED ORDER — LOSARTAN POTASSIUM 50 MG PO TABS: 50.0000 mg | ORAL_TABLET | Freq: Two times a day (BID) | ORAL | 3 refills | Status: DC

## 2016-11-12 NOTE — Patient Instructions (Signed)
WE NOW OFFER   [facility] FAST TRACK!!!  SAME DAY Appointments for ACUTE CARE  Such as: Sprains, Injuries, cuts, abrasions, rashes, muscle pain, joint pain, back pain Colds, flu, sore throats, headache, allergies, cough, fever  Ear pain, sinus and eye infections Abdominal pain, nausea, vomiting, diarrhea, upset stomach Animal/insect bites  3 Easy Ways to Schedule: Walk-In Scheduling Call in scheduling Mychart Sign-up: [email or web address]         

## 2016-11-12 NOTE — Progress Notes (Signed)
   Subjective:    Patient ID: Linda Mays, female    DOB: [birth year], 76 y.o.   MRN: [id number]  HPI Here to follow up on BP. She was seen here with a BP of 152/94. Her Losartan was increased to 50 mg bid, and this has worked well for her. Her BP at home has been running in the 120s to 130s over 60s to 70s. She feels fine.    Review of Systems  Constitutional: Negative.   Respiratory: Negative.   Cardiovascular: Negative.   Neurological: Negative.        Objective:   Physical Exam  Constitutional: She is oriented to person, place, and time. She appears well-developed and well-nourished.  Cardiovascular: Normal rate, regular rhythm, normal heart sounds and intact distal pulses.   Pulmonary/Chest: Effort normal and breath sounds normal. No respiratory distress. She has no wheezes. She has no rales.  Neurological: She is alert and oriented to person, place, and time.          Assessment & Plan:  HTN, now quite stable. Stay on current regimen.  Alysia Penna, MD

## 2016-11-20 DIAGNOSIS — H1131 Conjunctival hemorrhage, right eye: Secondary | ICD-10-CM | POA: Diagnosis not present

## 2017-01-07 DIAGNOSIS — H25813 Combined forms of age-related cataract, bilateral: Secondary | ICD-10-CM | POA: Diagnosis not present

## 2017-01-15 DIAGNOSIS — H6123 Impacted cerumen, bilateral: Secondary | ICD-10-CM | POA: Diagnosis not present

## 2017-03-01 ENCOUNTER — Ambulatory Visit (INDEPENDENT_AMBULATORY_CARE_PROVIDER_SITE_OTHER): Payer: Medicare Other | Admitting: *Deleted

## 2017-03-01 DIAGNOSIS — Z23 Encounter for immunization: Secondary | ICD-10-CM

## 2017-05-06 DIAGNOSIS — H6123 Impacted cerumen, bilateral: Secondary | ICD-10-CM | POA: Diagnosis not present

## 2017-06-01 ENCOUNTER — Ambulatory Visit: Payer: Self-pay

## 2017-06-01 NOTE — Telephone Encounter (Signed)
Pt. called to report change in how she reacts to the Losartan.  Reported she picked up a refill about 5 days ago, and since she is taking the new prescription, it causes a bitter taste in her mouth, and nausea.  Reported she checked with the pharmacist at [facility] re: possible change in Vendors.  Was advised that the Vendor is the same.  Stated she stopped the Losartan today, due to the side effects. Reported BP this AM was 134/73, pulse 63.  Stated she does not want to take any more of this Losartan prescription, and wanted to make Dr. Sarajane Jews aware. Will send message to Dr. Sarajane Jews.    Reason for Disposition . Caller has NON-URGENT medication question about med that PCP prescribed and triager unable to answer question  Answer Assessment - Initial Assessment Questions 1. SYMPTOMS: "Do you have any symptoms?"     Losartan caused a bitter taste in her mouth; c/o nausea; stated she picked up a new Rx approx. 5 days ago. 2. SEVERITY: If symptoms are present, ask "Are they mild, moderate or severe?"     "Mild; just like it wasn't agreeing with me."  Protocols used: MEDICATION QUESTION CALL-A-AH

## 2017-06-01 NOTE — Telephone Encounter (Signed)
Tell her to stop it and watch the BP over the next few weeks. Give [place] a report in 2 weeks

## 2017-06-01 NOTE — Telephone Encounter (Signed)
Dr. Fry - Please advise. Thanks! 

## 2017-06-02 NOTE — Telephone Encounter (Signed)
Called and spoke with pt. Pt advised and voiced understanding.  

## 2017-07-02 ENCOUNTER — Telehealth: Payer: Self-pay | Admitting: Family Medicine

## 2017-07-02 MED ORDER — METOPROLOL SUCCINATE ER 100 MG PO TB24: 100.0000 mg | ORAL_TABLET | Freq: Two times a day (BID) | ORAL | 3 refills | Status: DC

## 2017-07-02 MED ORDER — SYNTHROID 50 MCG PO TABS: 50.0000 ug | ORAL_TABLET | Freq: Every day | ORAL | 3 refills | Status: DC

## 2017-07-02 NOTE — Telephone Encounter (Signed)
Copied from [place]. Topic: Quick Communication - Rx Refill/Question >> Jul 02, 2017  9:20 AM Sandi Mariscal E, NT wrote: Medication: metoprolol succinate (TOPROL-XL) 100 MG 24 hr tablet 30 day supply and SYNTHROID 50 MCG tablet   Has the patient contacted their pharmacy? Yes    (Agent: If no, request that the patient contact the pharmacy for the refill.)   Preferred Pharmacy (with phone number or street name): [facility], [place] [phone] (Phone) [phone] (Fax)     Agent: Please be advised that RX refills may take up to 3 business days. We ask that you follow-up with your pharmacy.

## 2017-11-17 DIAGNOSIS — H6123 Impacted cerumen, bilateral: Secondary | ICD-10-CM | POA: Diagnosis not present

## 2018-02-16 ENCOUNTER — Ambulatory Visit (INDEPENDENT_AMBULATORY_CARE_PROVIDER_SITE_OTHER): Payer: Medicare Other

## 2018-02-16 DIAGNOSIS — Z23 Encounter for immunization: Secondary | ICD-10-CM | POA: Diagnosis not present

## 2018-03-10 DIAGNOSIS — H6123 Impacted cerumen, bilateral: Secondary | ICD-10-CM | POA: Diagnosis not present

## 2018-06-15 ENCOUNTER — Encounter: Payer: Self-pay | Admitting: Family Medicine

## 2018-06-15 ENCOUNTER — Ambulatory Visit (INDEPENDENT_AMBULATORY_CARE_PROVIDER_SITE_OTHER): Payer: Medicare Other | Admitting: Family Medicine

## 2018-06-15 VITALS — BP 120/84 | HR 78 | Temp 98.2°F | Ht 60.0 in | Wt 122.6 lb

## 2018-06-15 DIAGNOSIS — E039 Hypothyroidism, unspecified: Secondary | ICD-10-CM | POA: Diagnosis not present

## 2018-06-15 DIAGNOSIS — R233 Spontaneous ecchymoses: Secondary | ICD-10-CM

## 2018-06-15 DIAGNOSIS — R739 Hyperglycemia, unspecified: Secondary | ICD-10-CM | POA: Diagnosis not present

## 2018-06-15 DIAGNOSIS — R238 Other skin changes: Secondary | ICD-10-CM | POA: Diagnosis not present

## 2018-06-15 DIAGNOSIS — I10 Essential (primary) hypertension: Secondary | ICD-10-CM | POA: Diagnosis not present

## 2018-06-15 LAB — CBC WITH DIFFERENTIAL/PLATELET
Basophils Absolute: 0 10*3/uL (ref 0.0–0.1)
Basophils Relative: 0.3 % (ref 0.0–3.0)
Eosinophils Absolute: 0.1 10*3/uL (ref 0.0–0.7)
Eosinophils Relative: 2.5 % (ref 0.0–5.0)
HCT: 43.3 % (ref 36.0–46.0)
Hemoglobin: 15.1 g/dL — ABNORMAL HIGH (ref 12.0–15.0)
Lymphocytes Relative: 35.2 % (ref 12.0–46.0)
Lymphs Abs: 1.8 10*3/uL (ref 0.7–4.0)
MCHC: 34.8 g/dL (ref 30.0–36.0)
MCV: 94 fl (ref 78.0–100.0)
Monocytes Absolute: 0.5 10*3/uL (ref 0.1–1.0)
Monocytes Relative: 10.5 % (ref 3.0–12.0)
Neutro Abs: 2.7 10*3/uL (ref 1.4–7.7)
Neutrophils Relative %: 51.5 % (ref 43.0–77.0)
Platelets: 180 10*3/uL (ref 150.0–400.0)
RBC: 4.61 Mil/uL (ref 3.87–5.11)
RDW: 12 % (ref 11.5–15.5)
WBC: 5.2 10*3/uL (ref 4.0–10.5)

## 2018-06-15 LAB — LIPID PANEL
Cholesterol: 191 mg/dL (ref 0–200)
HDL: 53.7 mg/dL (ref >39.00)
LDL Cholesterol: 119 mg/dL — ABNORMAL HIGH (ref 0–99)
NonHDL: 137.58
Total CHOL/HDL Ratio: 4
Triglycerides: 95 mg/dL (ref 0.0–149.0)
VLDL: 19 mg/dL (ref 0.0–40.0)

## 2018-06-15 LAB — HEPATIC FUNCTION PANEL
ALT: 88 U/L — ABNORMAL HIGH (ref 0–35)
AST: 44 U/L — ABNORMAL HIGH (ref 0–37)
Albumin: 4.3 g/dL (ref 3.5–5.2)
Alkaline Phosphatase: 96 U/L (ref 39–117)
Bilirubin, Direct: 0.2 mg/dL (ref 0.0–0.3)
Total Bilirubin: 1 mg/dL (ref 0.2–1.2)
Total Protein: 6.8 g/dL (ref 6.0–8.3)

## 2018-06-15 LAB — BASIC METABOLIC PANEL
BUN: 14 mg/dL (ref 6–23)
CO2: 28 mEq/L (ref 19–32)
Calcium: 9.6 mg/dL (ref 8.4–10.5)
Chloride: 103 mEq/L (ref 96–112)
Creatinine, Ser: 0.7 mg/dL (ref 0.40–1.20)
GFR: 80.6 mL/min (ref >60.00)
Glucose, Bld: 91 mg/dL (ref 70–99)
Potassium: 5 mEq/L (ref 3.5–5.1)
Sodium: 139 mEq/L (ref 135–145)

## 2018-06-15 LAB — POC URINALSYSI DIPSTICK (AUTOMATED)
Bilirubin, UA: NEGATIVE
Blood, UA: NEGATIVE
Glucose, UA: NEGATIVE
Ketones, UA: NEGATIVE
Leukocytes, UA: NEGATIVE
Nitrite, UA: NEGATIVE
Protein, UA: NEGATIVE
Spec Grav, UA: 1.01 (ref 1.010–1.025)
Urobilinogen, UA: 0.2 E.U./dL
pH, UA: 6 (ref 5.0–8.0)

## 2018-06-15 LAB — PROTIME-INR
INR: 1 ratio (ref 0.8–1.0)
Prothrombin Time: 11.9 s (ref 9.6–13.1)

## 2018-06-15 LAB — HEMOGLOBIN A1C: Hgb A1c MFr Bld: 5.1 % (ref 4.6–6.5)

## 2018-06-15 LAB — APTT: aPTT: 27.6 s (ref 23.4–32.7)

## 2018-06-15 LAB — TSH: TSH: 4.03 u[IU]/mL (ref 0.35–4.50)

## 2018-06-15 LAB — T4, FREE: Free T4: 1.08 ng/dL (ref 0.60–1.60)

## 2018-06-15 LAB — T3, FREE: T3, Free: 3.6 pg/mL (ref 2.3–4.2)

## 2018-06-15 MED ORDER — SYNTHROID 50 MCG PO TABS: 50.0000 ug | ORAL_TABLET | Freq: Every day | ORAL | 3 refills | Status: DC

## 2018-06-15 MED ORDER — METOPROLOL SUCCINATE ER 100 MG PO TB24: 100.0000 mg | ORAL_TABLET | Freq: Two times a day (BID) | ORAL | 3 refills | Status: DC

## 2018-06-15 NOTE — Progress Notes (Signed)
   Subjective:    Patient ID: Linda Mays, female    DOB: 1940-12-30, 78 y.o.   MRN: [id number]  HPI Here for medication refills and also with concerns that her blood may be "too thin". She says tat little cuts seem to take longer to stop bleeding than they used to. She denies any unusual bruising. No nosebleeds or blood in the urine. She does not take aspirin. She is concerned that her thyroid levels are off. She has not had lab work done for about 2 and 1/2 years. Her weight is stable.    Review of Systems  Constitutional: Negative.   Respiratory: Negative.   Cardiovascular: Negative.   Gastrointestinal: Negative.   Endocrine: Negative.   Genitourinary: Negative.   Neurological: Negative.   Hematological: Bruises/bleeds easily.       Objective:   Physical Exam Constitutional:      Appearance: Normal appearance.  Neck:     Comments: Thyroid feels normal  Cardiovascular:     Rate and Rhythm: Normal rate and regular rhythm.     Pulses: Normal pulses.     Heart sounds: Normal heart sounds.  Pulmonary:     Effort: Pulmonary effort is normal.     Breath sounds: Normal breath sounds.  Abdominal:     General: Abdomen is flat. Bowel sounds are normal. There is no distension.     Palpations: Abdomen is soft. There is no mass.     Tenderness: There is no abdominal tenderness. There is no guarding or rebound.     Hernia: No hernia is present.  Lymphadenopathy:     Cervical: No cervical adenopathy.  Skin:    Findings: No bruising.  Neurological:     General: No focal deficit present.     Mental Status: She is alert and oriented to person, place, and time.           Assessment & Plan:  Her HTN is stable. I do not think she has any real clotting issues, but we will check labs including CBC, PT and PTT. Check a thyroid panel.  Alysia Penna, MD

## 2018-07-12 ENCOUNTER — Telehealth: Payer: Self-pay | Admitting: Family Medicine

## 2018-07-12 MED ORDER — CIPROFLOXACIN HCL 500 MG PO TABS: 500.0000 mg | ORAL_TABLET | Freq: Two times a day (BID) | ORAL | 0 refills | Status: DC

## 2018-07-12 NOTE — Telephone Encounter (Signed)
Called and spoke with pt and she is aware of rx that has been sent to the pharmacy for her.

## 2018-07-12 NOTE — Telephone Encounter (Signed)
Call in Cipro 500 mg bid for 7 days  

## 2018-07-12 NOTE — Telephone Encounter (Signed)
Patient Believes she has another UTI. States having Back PX and burning with Urination. Patient states she has had multiple UTIs in the past. Patient would like to know if she can get medication sent in so she does not have to come into the clinic with everything going on. States she cannot take Sulfa Drugs.   Please advise

## 2018-11-25 ENCOUNTER — Other Ambulatory Visit: Payer: Self-pay

## 2019-02-03 DIAGNOSIS — H6123 Impacted cerumen, bilateral: Secondary | ICD-10-CM | POA: Diagnosis not present

## 2019-02-23 ENCOUNTER — Telehealth: Payer: Self-pay

## 2019-02-23 NOTE — Telephone Encounter (Signed)
Spoke to Jaeley and she stated that she called regarding BP. Pt stated it was okay until last week. Highest readings 166/95. Pt stated its elevated in the evenings. Pt was advised that Bp machine could be the issue. Pt stated she had the machine for a long time. Pt wanted to increase metoprolol. Pt was advised to come into the office. Pt has been scheduled.

## 2019-02-23 NOTE — Telephone Encounter (Signed)
Copied from [place] [id number]. Topic: General - Other >> Feb 23, 2019  8:57 AM Keene Breath wrote: Reason for CRM: Patient would like to speak with the nurse regarding her BP, which has been elevated this week.  CB#  [phone]

## 2019-02-24 NOTE — Telephone Encounter (Signed)
Set her up for an in office OV and have her bring her own BP cuff with her. So far the only appt she has is for a flu shot

## 2019-02-24 NOTE — Telephone Encounter (Signed)
Pt notified to bring in Bp cuff. Pt verbalized understanding.

## 2019-02-27 ENCOUNTER — Encounter: Payer: Self-pay | Admitting: Family Medicine

## 2019-02-27 ENCOUNTER — Other Ambulatory Visit: Payer: Self-pay

## 2019-02-27 ENCOUNTER — Ambulatory Visit (INDEPENDENT_AMBULATORY_CARE_PROVIDER_SITE_OTHER): Payer: Medicare Other | Admitting: Family Medicine

## 2019-02-27 VITALS — BP 160/80 | HR 71 | Temp 98.1°F | Ht 60.0 in | Wt 125.6 lb

## 2019-02-27 DIAGNOSIS — I10 Essential (primary) hypertension: Secondary | ICD-10-CM

## 2019-02-27 DIAGNOSIS — Z23 Encounter for immunization: Secondary | ICD-10-CM | POA: Diagnosis not present

## 2019-02-27 MED ORDER — AMLODIPINE BESYLATE 5 MG PO TABS: 5.0000 mg | ORAL_TABLET | Freq: Every day | ORAL | 3 refills | Status: DC

## 2019-02-27 NOTE — Progress Notes (Signed)
   Subjective:    Patient ID: Linda Mays, female    DOB: 1940-10-12, 78 y.o.   MRN: [id number]  HPI Here for elevated BP. This started to go up several weeks ago, but she cannot explain why. She feels a mild tightness in the chest and arms when it gets high. She seems to have normal readings in the mornings (130s to 140s over 70s) but it goes high in the evenings or udirng the night (these can get as high as the 170s over 80s). She has been taking Metoprolol XL 100 mg bid for several years. No ankle swelling.    Review of Systems  Constitutional: Negative.   Respiratory: Positive for chest tightness. Negative for cough, shortness of breath and wheezing.   Cardiovascular: Negative.   Neurological: Negative.        Objective:   Physical Exam Constitutional:      Appearance: Normal appearance. She is not ill-appearing.  Cardiovascular:     Rate and Rhythm: Normal rate and regular rhythm.     Pulses: Normal pulses.     Heart sounds: Normal heart sounds.  Pulmonary:     Effort: Pulmonary effort is normal.     Breath sounds: Normal breath sounds.  Musculoskeletal:     Right lower leg: No edema.     Left lower leg: No edema.  Neurological:     Mental Status: She is alert.           Assessment & Plan:  Her HTN has been poorly controlled in the evenings. We will stay on the current dosing of Metoprolol but we will add Amlodipine 5 mg every day in the evenings. Recheck in 3-4 weeks.  Alysia Penna, MD

## 2019-03-07 ENCOUNTER — Ambulatory Visit: Payer: Medicare Other

## 2019-05-17 ENCOUNTER — Ambulatory Visit: Payer: Self-pay | Admitting: Family Medicine

## 2019-05-17 NOTE — Telephone Encounter (Signed)
Pt calling, concerned BP trending up past 2 days. This AM, 149/84, HR 64.  States diastolic in [id number] past 2 days, 90 last HS. Systolic has been high [id number], "Sometimes 150s at night." Denies any headache, dizziness, no weakness or visual changes, denies pain, no new stressors.  No missed doses of BP meds. States "Concerned this is high for me and may need medication adjustment."  Assured pt NT would route to practice for Dr. Barbie Banner review.  CB# [phone]  Reason for Disposition . [id number] Systolic BP  >= [id number] OR Diastolic >= 80 AND [id number] taking BP medications  Answer Assessment - Initial Assessment Questions 1. BLOOD PRESSURE: "What is the blood pressure?" "Did you take at least two measurements 5 minutes apart?"     149/84 2. ONSET: "When did you take your blood pressure?"     This am 3. HOW: "How did you obtain the blood pressure?" (e.g., visiting nurse, automatic home BP monitor)     home 4. HISTORY: "Do you have a history of high blood pressure?"     yes 5. MEDICATIONS: "Are you taking any medications for blood pressure?" "Have you missed any doses recently?"     Yes, no missed doses 6. OTHER SYMPTOMS: "Do you have any symptoms?" (e.g., headache, chest pain, blurred vision, difficulty breathing, weakness)     none  Protocols used: HIGH BLOOD PRESSURE-A-AH

## 2019-05-17 NOTE — Telephone Encounter (Signed)
Spoke with patient. Per patient BP has only started rising the last couple days. Patient is unsure of why. Informed patient that diastolic readings in the [id number] is okay. Patient verbalized understanding but still wants PCP to be aware in case med adjustment is needed. Informed patient that she was supposed to f/u with PCP 3-4 weeks after her 11/20 OV. Patient does not want to come into office since she is getting her COVID-19 vaccine on Tuesday. Informed her we can do a Telephone Call to further assess. But patient wants PCP to decide if really needed. Please advise.

## 2019-05-17 NOTE — Telephone Encounter (Signed)
Tell her increase the Amlodipine to 2 tabs a day (for a total of 10 mg). Do this for a week and then report back to [place] with a virtual visit

## 2019-05-18 NOTE — Telephone Encounter (Signed)
Spoke with the patient. She is aware to take 2 tabs and then schedule a virtual appointment. Patient stated she would call back for appointment.

## 2019-06-14 ENCOUNTER — Telehealth: Payer: Self-pay | Admitting: Family Medicine

## 2019-06-14 ENCOUNTER — Other Ambulatory Visit: Payer: Self-pay | Admitting: Family Medicine

## 2019-06-14 MED ORDER — SYNTHROID 50 MCG PO TABS: 50.0000 ug | ORAL_TABLET | Freq: Every day | ORAL | 3 refills | Status: DC

## 2019-06-14 MED ORDER — METOPROLOL SUCCINATE ER 100 MG PO TB24: 100.0000 mg | ORAL_TABLET | Freq: Two times a day (BID) | ORAL | 3 refills | Status: DC

## 2019-06-14 NOTE — Telephone Encounter (Signed)
RX sent in. Left a detailed message on verified voice mail.

## 2019-06-14 NOTE — Telephone Encounter (Signed)
Patient needs refills for 90 days on Metoprolol and Synthroid called in to [facility] in [place].  Patient wants to know when medication is called in.  Okay to leave a message.

## 2019-06-28 ENCOUNTER — Other Ambulatory Visit: Payer: Self-pay

## 2019-06-29 ENCOUNTER — Ambulatory Visit (INDEPENDENT_AMBULATORY_CARE_PROVIDER_SITE_OTHER): Payer: Medicare Other

## 2019-06-29 ENCOUNTER — Encounter: Payer: Self-pay | Admitting: Family Medicine

## 2019-06-29 ENCOUNTER — Ambulatory Visit (INDEPENDENT_AMBULATORY_CARE_PROVIDER_SITE_OTHER): Payer: Medicare Other | Admitting: Family Medicine

## 2019-06-29 VITALS — BP 130/80 | HR 72 | Temp 97.1°F | Wt 125.6 lb

## 2019-06-29 DIAGNOSIS — M546 Pain in thoracic spine: Secondary | ICD-10-CM

## 2019-06-29 MED ORDER — METHYLPREDNISOLONE 4 MG PO TBPK: ORAL_TABLET | ORAL | 0 refills | Status: DC

## 2019-06-29 MED ORDER — CYCLOBENZAPRINE HCL 10 MG PO TABS: 10.0000 mg | ORAL_TABLET | Freq: Three times a day (TID) | ORAL | 1 refills | Status: DC | PRN

## 2019-06-29 NOTE — Progress Notes (Signed)
   Subjective:    Patient ID: Linda Mays, female    DOB: 01/17/1941, 79 y.o.   MRN: [id number]  HPI Here for one week of intermittent spasms and sharp pains in the right upper back. She has never had these before. No urinary symptoms or fever. No cough or SOB. She notes that about 4 weeks ago she was at home and she feel off a 2 step ladder, landing on her right side on the floor. She had some mild soreness for a day after that, and then she felt fine. This week she has been using hot showers and taking Ibuprofen.    Review of Systems  Constitutional: Negative.   Respiratory: Negative.   Cardiovascular: Negative.   Genitourinary: Negative.   Musculoskeletal: Positive for back pain.       Objective:   Physical Exam Constitutional:      General: She is not in acute distress.    Appearance: Normal appearance.  Cardiovascular:     Rate and Rhythm: Normal rate and regular rhythm.     Pulses: Normal pulses.     Heart sounds: Normal heart sounds.  Pulmonary:     Effort: Pulmonary effort is normal.     Breath sounds: Normal breath sounds.  Musculoskeletal:     Comments: There is no spinal tenderness, but she is tender several cm to the right of the upper spine and just under the scapula. ROM is full.   Neurological:     General: No focal deficit present.     Mental Status: She is alert and oriented to person, place, and time.           Assessment & Plan:  She is having muscle spasms of the upper back, likely from a pinched nerve. We will get Xrays today of the thoracic spine. She will use moist heat, Flexeril, and a Medrol dose pack. Recheck prn.  Alysia Penna, MD

## 2019-07-03 ENCOUNTER — Telehealth: Payer: Self-pay | Admitting: Family Medicine

## 2019-07-03 NOTE — Telephone Encounter (Signed)
Please advise. Is there an alternate Rx the patient could try?

## 2019-07-03 NOTE — Telephone Encounter (Signed)
Pt was seen for muscle spasms and had side effects from the medications prescribed. She did not complete medications and would like a call back in regards to the medication.   Patient Phone: [phone]

## 2019-07-03 NOTE — Telephone Encounter (Signed)
Stop the Flexeril. Instead call in Skelaxin 400 mg TID as needed for muscle spasms, #60 with one rf

## 2019-07-04 ENCOUNTER — Telehealth: Payer: Self-pay | Admitting: *Deleted

## 2019-07-04 MED ORDER — METAXALONE 400 MG PO TABS: ORAL_TABLET | ORAL | 1 refills | Status: DC

## 2019-07-04 NOTE — Telephone Encounter (Signed)
Patient is aware. Nothing further needed.  

## 2019-07-04 NOTE — Telephone Encounter (Signed)
Patient was advised by after hours nurse to follow-up with Dr. Sarajane Jews within 2-3 days.

## 2019-07-05 ENCOUNTER — Other Ambulatory Visit: Payer: Self-pay

## 2019-07-05 NOTE — Telephone Encounter (Signed)
Form received from [facility] regarding metaxalone. Insurance does not cover this medication. Insurance prefers tilanidine, baclofen, carisoprodol or methocarbamol.

## 2019-07-05 NOTE — Telephone Encounter (Signed)
Cancel Metaxolone, and call in Methocarbamol 500 mg to take every 6 hours prn spasms, #60 with 2 rf

## 2019-07-06 MED ORDER — METHOCARBAMOL 500 MG PO TABS: ORAL_TABLET | ORAL | 2 refills | Status: DC

## 2019-07-06 NOTE — Telephone Encounter (Signed)
Rx has been changed. Left a detailed message on verified voice mail.

## 2019-07-06 NOTE — Addendum Note (Signed)
Addended by: Rebecca Eaton on: 07/06/2019 09:19 AM   Modules accepted: Orders

## 2019-08-22 ENCOUNTER — Other Ambulatory Visit: Payer: Self-pay

## 2019-08-23 ENCOUNTER — Encounter: Payer: Self-pay | Admitting: Family Medicine

## 2019-08-23 ENCOUNTER — Ambulatory Visit (INDEPENDENT_AMBULATORY_CARE_PROVIDER_SITE_OTHER): Payer: Medicare Other | Admitting: Family Medicine

## 2019-08-23 VITALS — BP 120/64 | HR 73 | Temp 97.3°F | Wt 124.6 lb

## 2019-08-23 DIAGNOSIS — L309 Dermatitis, unspecified: Secondary | ICD-10-CM | POA: Diagnosis not present

## 2019-08-23 MED ORDER — TRIAMCINOLONE ACETONIDE 0.1 % EX CREA: 1.0000 "application " | TOPICAL_CREAM | Freq: Two times a day (BID) | CUTANEOUS | 2 refills | Status: DC

## 2019-08-23 NOTE — Progress Notes (Signed)
   Subjective:    Patient ID: Linda Mays, female    DOB: 09-24-1940, 79 y.o.   MRN: [id number]  HPI Here for 2 weeks of an itchy burning rash on the back of the neck. Using Polysporin with no relief. She had eczema as a child but it seemed to go away.    Review of Systems  Constitutional: Negative.   Respiratory: Negative.   Cardiovascular: Negative.   Skin: Positive for rash.       Objective:   Physical Exam Constitutional:      Appearance: Normal appearance.  Cardiovascular:     Rate and Rhythm: Normal rate and regular rhythm.     Pulses: Normal pulses.     Heart sounds: Normal heart sounds.  Pulmonary:     Effort: Pulmonary effort is normal.     Breath sounds: Normal breath sounds.  Skin:    Comments: There are areas of macular erythema with scaling on the posterior and left lateral neck   Neurological:     Mental Status: She is alert.           Assessment & Plan:  Eczema. Treat with Triamcinolone cream prn.  Alysia Penna, MD

## 2019-11-06 DIAGNOSIS — R202 Paresthesia of skin: Secondary | ICD-10-CM | POA: Diagnosis not present

## 2019-11-06 DIAGNOSIS — Z79899 Other long term (current) drug therapy: Secondary | ICD-10-CM | POA: Diagnosis not present

## 2019-11-06 DIAGNOSIS — Z888 Allergy status to other drugs, medicaments and biological substances status: Secondary | ICD-10-CM | POA: Diagnosis not present

## 2019-11-06 DIAGNOSIS — Z981 Arthrodesis status: Secondary | ICD-10-CM | POA: Diagnosis not present

## 2019-11-06 DIAGNOSIS — M79604 Pain in right leg: Secondary | ICD-10-CM | POA: Diagnosis not present

## 2019-11-06 DIAGNOSIS — Z880 Allergy status to penicillin: Secondary | ICD-10-CM | POA: Diagnosis not present

## 2019-11-06 DIAGNOSIS — R531 Weakness: Secondary | ICD-10-CM | POA: Diagnosis not present

## 2019-11-06 DIAGNOSIS — E039 Hypothyroidism, unspecified: Secondary | ICD-10-CM | POA: Diagnosis present

## 2019-11-06 DIAGNOSIS — Z882 Allergy status to sulfonamides status: Secondary | ICD-10-CM | POA: Diagnosis not present

## 2019-11-06 DIAGNOSIS — G459 Transient cerebral ischemic attack, unspecified: Secondary | ICD-10-CM

## 2019-11-06 DIAGNOSIS — E785 Hyperlipidemia, unspecified: Secondary | ICD-10-CM | POA: Diagnosis present

## 2019-11-06 DIAGNOSIS — Z8673 Personal history of transient ischemic attack (TIA), and cerebral infarction without residual deficits: Secondary | ICD-10-CM | POA: Diagnosis not present

## 2019-11-06 DIAGNOSIS — I6523 Occlusion and stenosis of bilateral carotid arteries: Secondary | ICD-10-CM | POA: Diagnosis not present

## 2019-11-06 DIAGNOSIS — Z79890 Hormone replacement therapy (postmenopausal): Secondary | ICD-10-CM | POA: Diagnosis not present

## 2019-11-06 DIAGNOSIS — R200 Anesthesia of skin: Secondary | ICD-10-CM | POA: Diagnosis not present

## 2019-11-06 DIAGNOSIS — M79605 Pain in left leg: Secondary | ICD-10-CM | POA: Diagnosis not present

## 2019-11-06 DIAGNOSIS — R600 Localized edema: Secondary | ICD-10-CM | POA: Diagnosis not present

## 2019-11-06 DIAGNOSIS — Z90710 Acquired absence of both cervix and uterus: Secondary | ICD-10-CM | POA: Diagnosis not present

## 2019-11-06 DIAGNOSIS — I10 Essential (primary) hypertension: Secondary | ICD-10-CM | POA: Diagnosis present

## 2019-11-06 DIAGNOSIS — Z881 Allergy status to other antibiotic agents status: Secondary | ICD-10-CM | POA: Diagnosis not present

## 2019-11-06 HISTORY — DX: Transient cerebral ischemic attack, unspecified: G45.9

## 2019-11-08 ENCOUNTER — Telehealth: Payer: Self-pay | Admitting: Family Medicine

## 2019-11-08 NOTE — Telephone Encounter (Signed)
The patient was in the [facility] Monday and released today  She just wanted to let Sarajane Jews know that she had multiple tests done and they should be faxed to the office.  FYI  I have the patient scheduled for a HFU 11/17/2019

## 2019-11-17 ENCOUNTER — Ambulatory Visit (INDEPENDENT_AMBULATORY_CARE_PROVIDER_SITE_OTHER): Payer: Medicare Other | Admitting: Family Medicine

## 2019-11-17 ENCOUNTER — Other Ambulatory Visit: Payer: Self-pay

## 2019-11-17 ENCOUNTER — Encounter: Payer: Self-pay | Admitting: Family Medicine

## 2019-11-17 VITALS — BP 104/80 | HR 72 | Temp 97.9°F | Ht 60.0 in | Wt 123.0 lb

## 2019-11-17 DIAGNOSIS — Q2112 Patent foramen ovale: Secondary | ICD-10-CM

## 2019-11-17 DIAGNOSIS — I10 Essential (primary) hypertension: Secondary | ICD-10-CM | POA: Diagnosis not present

## 2019-11-17 DIAGNOSIS — G459 Transient cerebral ischemic attack, unspecified: Secondary | ICD-10-CM | POA: Diagnosis not present

## 2019-11-17 DIAGNOSIS — Q211 Atrial septal defect: Secondary | ICD-10-CM | POA: Diagnosis not present

## 2019-11-17 NOTE — Progress Notes (Signed)
   Subjective:    Patient ID: Linda Mays, female    DOB: 08-Sep-1940, 79 y.o.   MRN: [id number]  HPI Here with her daughter to follow up a hospital stay at [facility] from 11-06-19 to 11-08-19 for what was deemed to be a TIA. On the day of admission she developed numbness and tingling in the right face, right arm, and right leg. No chest pain or SOB. No other neurologic deficits. This persisted for 3 hours and then resolved. She has not had anything like this ever since. She was admitted and had multiple tests run, including a brain MRA, and this was negative for any acute lesions. Her ECHO was normal except for mild relaxation delay and a small PFO. Labs showed an A1c of 5.3 and an LDL of 102, otherwise normal. Venous dopplers of the legs were clear. Her BP was mildly elevated to the 160s over 90s at first, but this quickly settled down. It was felt she had had a TIA. She was sent home on her usual medications, but they added an 81 mg ASA daily as well as Lipitor 40 mg. Of note, the daughter with her today has had a thalamic stroke.    Review of Systems  Constitutional: Negative.   Respiratory: Negative.   Cardiovascular: Negative.   Gastrointestinal: Negative.   Genitourinary: Negative.   Neurological: Negative.        Objective:   Physical Exam Constitutional:      Appearance: Normal appearance. She is not ill-appearing.  Cardiovascular:     Rate and Rhythm: Normal rate and regular rhythm.     Pulses: Normal pulses.     Heart sounds: Normal heart sounds. No murmur heard.   Pulmonary:     Effort: Pulmonary effort is normal.     Breath sounds: Normal breath sounds.  Neurological:     General: No focal deficit present.     Mental Status: She is alert and oriented to person, place, and time. Mental status is at baseline.     Cranial Nerves: No cranial nerve deficit.     Motor: No weakness.     Coordination: Coordination normal.     Gait: Gait normal.             Assessment & Plan:  She has had a TIA. She will continue on the current medications. At her request we will refer her to Cardiology to evaluate the PFO further. Recheck in 3 months. Alysia Penna, MD

## 2019-12-01 ENCOUNTER — Telehealth: Payer: Self-pay | Admitting: Family Medicine

## 2019-12-01 ENCOUNTER — Other Ambulatory Visit: Payer: Self-pay | Admitting: *Deleted

## 2019-12-01 MED ORDER — ATORVASTATIN CALCIUM 40 MG PO TABS: 40.0000 mg | ORAL_TABLET | Freq: Every day | ORAL | 3 refills | Status: DC

## 2019-12-01 NOTE — Addendum Note (Signed)
Addended by: Zacarias Pontes on: 12/01/2019 04:35 PM   Modules accepted: Orders

## 2019-12-01 NOTE — Telephone Encounter (Signed)
Spoke with patient and she stated that medication was supposed to be added to her list when she saw Dr. Sarajane Jews on 11/17/2019 because he told her to keep taking medication. Rx sent to the pharmacy as requested.

## 2019-12-01 NOTE — Telephone Encounter (Signed)
Pt stated when she went to the ED room a few weeks ago and was prescribed atorvastatin 40 mg. Pt said she is down to 6 pills and is wondering about a refill?   Pharmacy:  [facility] [address] [place] Phone:  [phone]  Fax:  [phone]

## 2019-12-21 ENCOUNTER — Telehealth: Payer: Self-pay | Admitting: Family Medicine

## 2019-12-21 NOTE — Telephone Encounter (Signed)
Pt is calling in stating that her Bp was very good and now since coming from the hospital in the evening she has been experiencing elevated pressure (23) 145/79 am 153/87 pm  (24) 135/78 am 173/89 pm  (25) 135/78 am 164/85 pm).  Pt thinks that it can be her medication is not enough but she would like for Dr. Sarajane Jews to let her know what she should do.  Pt declined to make a appointment do not want to come in to the office or go to the ER if she can help it.  Pt would like to have a call back today.

## 2019-12-22 NOTE — Telephone Encounter (Signed)
Spoke with the patient. She is aware of the medication changes.

## 2019-12-22 NOTE — Telephone Encounter (Signed)
Increase the Amlodipine to take a 5 mg tablet TWICE a day for a week and then report back to [place]

## 2020-01-10 NOTE — Progress Notes (Signed)
Cardiology Office Note   Date:  01/11/2020   ID:  CHENEY GOSCH, DOB 04-08-1941, MRN [id number]  PCP:  Laurey Morale, MD  Cardiologist:   No primary care provider on file. Referring:  Laurey Morale, MD  Chief Complaint  Patient presents with  . Transient Ischemic Attack      History of Present Illness: Linda Mays is a 79 y.o. female who is referred by Laurey Morale, MD for evaluation of TIA.  I saw her several years ago.  In the year when her symptoms were at that point but I do see that in 2014 she had an unremarkable echo.  She had renal Dopplers that were unremarkable.  She had a monitor that demonstrated some short runs of SVT and frequent PVCs.  She has not had any other cardiac work-up or problems.  She was in the hospital at [facility] in July.  I was able to see some records.  She had symptoms of right arm hip and knee numbness.  It was felt to be a TIA.  They did an extensive work-up with a negative MRI, negative DVT on venous Dopplers and no evidence of carotid stenosis.  She did have an echocardiogram that was mentioned to demonstrate a PFO with left atrial enlargement though I do not have these results.  She was sent home on aspirin and statin in addition to her other meds.  She otherwise has done well.  She does household chores. The patient denies any new symptoms such as chest discomfort, neck or arm discomfort. There has been no new shortness of breath, PND or orthopnea. There have been no reported palpitations, presyncope or syncope.  She has no residual neurologic symptoms.  Past Medical History:  Diagnosis Date  . Dyslipidemia   . GERD (gastroesophageal reflux disease)   . History of colonic polyps   . Hypertension   . Hypothyroidism   . Osteoarthritis   . Osteoporosis   . TIA (transient ischemic attack) 11/06/2019    Past Surgical History:  Procedure Laterality Date  . ABDOMINAL HYSTERECTOMY    . Bladder tack procedure    . BREAST LUMPECTOMY    .  OOPHORECTOMY       Current Outpatient Medications  Medication Sig Dispense Refill  . amLODipine (NORVASC) 2.5 MG tablet Take 1 tablet (2.5 mg total) by mouth at bedtime. 90 tablet 3  . atorvastatin (LIPITOR) 40 MG tablet Take 1 tablet (40 mg total) by mouth daily. 90 tablet 3  . metaxalone (SKELAXIN) 400 MG tablet Take 1 tablet by mouth 3 times a day as needed for muscle spasms 60 tablet 1  . methocarbamol (ROBAXIN) 500 MG tablet Take on e tablet by mouth ever 6 hours as needed for muscle spasms. 60 tablet 2  . metoprolol succinate (TOPROL-XL) 100 MG 24 hr tablet Take 1 tablet (100 mg total) by mouth 2 (two) times daily. Take with or immediately following a meal. 180 tablet 3  . SYNTHROID 50 MCG tablet Take 1 tablet (50 mcg total) by mouth daily. 90 tablet 3  . triamcinolone cream (KENALOG) 0.1 % Apply 1 application topically 2 (two) times daily. 45 g 2   No current facility-administered medications for this visit.    Allergies:   Lisinopril-hydrochlorothiazide, Ace inhibitors, Doxycycline, Iophen c-nr [guaifenesin-codeine], Penicillins, and Sulfonamide derivatives    Social History:  The patient  reports that she has never smoked. She has never used smokeless tobacco. She reports that she does  not drink alcohol and does not use drugs.   Family History:  The patient's family history includes CAD (age of onset: 56) in her father; Colon cancer in an other family member; Diabetes in an other family member; Diverticulitis in her mother; Hypertension in an other family member; Prostate cancer in an other family member.    ROS:  Please see the history of present illness.   Otherwise, review of systems are positive for back spasm.   All other systems are reviewed and negative.    PHYSICAL EXAM: VS:  BP (!) 150/85   Pulse 63   Temp (!) 95.9 F (35.5 C)   Ht 5' (1.524 m)   Wt 123 lb 3.2 oz (55.9 kg)   SpO2 98%   BMI 24.06 kg/m  , BMI Body mass index is 24.06 kg/m. GENERAL:  Well  appearing HEENT:  Pupils equal round and reactive, fundi not visualized, oral mucosa unremarkable NECK:  No jugular venous distention, waveform within normal limits, carotid upstroke brisk and symmetric, no bruits, no thyromegaly LYMPHATICS:  No cervical, inguinal adenopathy LUNGS:  Clear to auscultation bilaterally BACK:  No CVA tenderness CHEST:  Unremarkable HEART:  PMI not displaced or sustained,S1 and S2 within normal limits, no S3, no S4, no clicks, no rubs, no murmurs ABD:  Flat, positive bowel sounds normal in frequency in pitch, no bruits, no rebound, no guarding, no midline pulsatile mass, no hepatomegaly, no splenomegaly EXT:  2 plus pulses throughout, no edema, no cyanosis no clubbing SKIN:  No rashes no nodules NEURO:  Cranial nerves II through XII grossly intact, motor grossly intact throughout PSYCH:  Cognitively intact, oriented to person place and time    EKG:  EKG is ordered today. The ekg ordered today demonstrates sinus rhythm, rate 63, axis within normal limits, intervals within normal limits, no acute ST-T wave changes.   Recent Labs: No results found for requested labs within last 8760 hours.    Lipid Panel    Component Value Date/Time   CHOL 191 06/15/2018 1026   TRIG 95.0 06/15/2018 1026   HDL 53.70 06/15/2018 1026   CHOLHDL 4 06/15/2018 1026   VLDL 19.0 06/15/2018 1026   LDLCALC 119 (H) 06/15/2018 1026   LDLDIRECT 168.9 04/01/2012 1131      Wt Readings from Last 3 Encounters:  01/11/20 123 lb 3.2 oz (55.9 kg)  11/17/19 123 lb (55.8 kg)  08/23/19 124 lb 9.6 oz (56.5 kg)      Other studies Reviewed: Additional studies/ records that were reviewed today include: [facility] . Review of the above records demonstrates:  Please see elsewhere in the note.     ASSESSMENT AND PLAN:  TIA: I did review the records and I will have to get the echocardiogram result from [place].  For now she is going to continue aspirin.  I will check an event  monitor for 4 weeks to see if there is any evidence of atrial fibrillation.  HTN: She did bring a blood pressure diary and her blood pressures are slightly elevated in the evening.  She is not been taking her amlodipine as prescribed.  She takes it as needed.  I am going to have her start taking 2 and half milligrams every morning and blood pressure diary.  DYSLIPIDEMIA: Her LDL previously had been 119.  She was just started on the statin and I can order a fasting lipid profile when she returns.  COVID EDUCATION:   She has had the vaccine.  Current  medicines are reviewed at length with the patient today.  The patient does not have concerns regarding medicines.  The following changes have been made:  no change  Labs/ tests ordered today include:   Orders Placed This Encounter  Procedures  . CARDIAC EVENT MONITOR  . EKG 12-Lead     Disposition:   FU with me in six weeks.     Signed, Minus Breeding, MD  01/11/2020 2:43 PM    [facility]

## 2020-01-11 ENCOUNTER — Ambulatory Visit (INDEPENDENT_AMBULATORY_CARE_PROVIDER_SITE_OTHER): Payer: Medicare Other | Admitting: Cardiology

## 2020-01-11 ENCOUNTER — Ambulatory Visit: Payer: Medicare Other | Admitting: Cardiology

## 2020-01-11 ENCOUNTER — Other Ambulatory Visit: Payer: Self-pay

## 2020-01-11 ENCOUNTER — Encounter: Payer: Self-pay | Admitting: Cardiology

## 2020-01-11 ENCOUNTER — Telehealth: Payer: Self-pay | Admitting: Radiology

## 2020-01-11 VITALS — BP 150/85 | HR 63 | Temp 95.9°F | Ht 60.0 in | Wt 123.2 lb

## 2020-01-11 DIAGNOSIS — G459 Transient cerebral ischemic attack, unspecified: Secondary | ICD-10-CM | POA: Diagnosis not present

## 2020-01-11 MED ORDER — AMLODIPINE BESYLATE 2.5 MG PO TABS
2.5000 mg | ORAL_TABLET | Freq: Every day | ORAL | 3 refills | Status: DC
Start: 2020-01-11 — End: 2020-09-13

## 2020-01-11 NOTE — Patient Instructions (Signed)
Medication Instructions:  Your physician recommends that you continue on your current medications as directed. Please refer to the Current Medication list given to you today.  *If you need a refill on your cardiac medications before your next appointment, please call your pharmacy*  Testing/Procedures: Your physician has recommended that you wear an event monitor. Event monitors are medical devices that record the heart's electrical activity. Doctors most often [place] these monitors to diagnose arrhythmias. Arrhythmias are problems with the speed or rhythm of the heartbeat. The monitor is a small, portable device. You can wear one while you do your normal daily activities. This is usually used to diagnose what is causing palpitations/syncope (passing out).  Follow-Up: At [facility], you and your health needs are our priority.  As part of our continuing mission to provide you with exceptional heart care, we have created designated Provider Care Teams.  These Care Teams include your primary Cardiologist (physician) and Advanced Practice Providers (APPs -  Physician Assistants and Nurse Practitioners) who all work together to provide you with the care you need, when you need it.  We recommend signing up for the patient portal called "MyChart".  Sign up information is provided on this After Visit Summary.  MyChart is used to connect with patients for Virtual Visits (Telemedicine).  Patients are able to view lab/test results, encounter notes, upcoming appointments, etc.  Non-urgent messages can be sent to your provider as well.   To learn more about what you can do with MyChart, go to [email or web address].    Your next appointment:   6 week(s)  The format for your next appointment:   In Person  Provider:   Minus Breeding, MD

## 2020-01-11 NOTE — Telephone Encounter (Signed)
Enrolled patient for a 30 day [employer] Event Monitor to be mailed to patients home  

## 2020-01-12 ENCOUNTER — Telehealth: Payer: Self-pay | Admitting: Cardiology

## 2020-01-12 NOTE — Telephone Encounter (Signed)
Faxed  Signed authorization to HIM  DEPT. At [place] records for Dr. Percival Spanish to review. 01/12/20 fsw

## 2020-01-12 NOTE — Telephone Encounter (Signed)
Received Echocardiogram report from [facility] on 01/12/20. Put in Dr. Rosezella [place] mailbox. 01/12/20 fsw

## 2020-01-22 ENCOUNTER — Telehealth: Payer: Self-pay | Admitting: Cardiology

## 2020-01-22 DIAGNOSIS — I4891 Unspecified atrial fibrillation: Secondary | ICD-10-CM

## 2020-01-22 DIAGNOSIS — G459 Transient cerebral ischemic attack, unspecified: Secondary | ICD-10-CM

## 2020-01-22 NOTE — Telephone Encounter (Signed)
Please schedule her to see EP and see if they would consider an implanted loop.

## 2020-01-22 NOTE — Telephone Encounter (Signed)
I spoke with patient. She has received monitor and watched instructional video.  She reports she is not good with electronics and feels this monitor may be too complicated.  She lives alone and does not have anyone to help her with monitor.  She does not think she will be able to change out batteries when needed.  She understands importance of monitor and would like to wear one but is asking if there is another type that may be less complicated.

## 2020-01-22 NOTE — Telephone Encounter (Signed)
New message:     Patient calling concerning her monitor she received in mail. Patient states that after going threw everything she feel like she wile not be able to handy that. Please call patient.

## 2020-01-23 ENCOUNTER — Telehealth: Payer: Self-pay | Admitting: Radiology

## 2020-01-23 NOTE — Telephone Encounter (Signed)
OK to wear a Zio patch

## 2020-01-23 NOTE — Telephone Encounter (Signed)
After much discussion, the patient agrees to 2 week Zio.  She understands to mail the event monitor back (she should not get charged as she did not get a baseline reading).  Insurance, mailing address confirmed. She was grateful for assistance.   Message sent to monitor technician to enroll/send monitor.

## 2020-01-23 NOTE — Telephone Encounter (Signed)
Enrolled patient for a 14 day Zio XT  monitor to be mailed to patients home  °

## 2020-01-23 NOTE — Telephone Encounter (Signed)
Ms. Florance is completely uninterested in EP visit after a long discussion about loop implant.  She is also still very overwhelmed about event monitor and does not wish to wear it due to battery changing. She states she wore a monitor years ago that was hard wired and she wishes to wear that again. The only hard-wired monitors available are 24 and 48 hour holters (and they are reserved for FFA patients required to have it).  The patient states she is not trying to be difficult but will not wear an event monitor and will not get a loop.  To Dr. Percival Spanish to see if a Elwyn Reach would be considered as there is nothing to change out on that monitor.

## 2020-01-27 ENCOUNTER — Other Ambulatory Visit (INDEPENDENT_AMBULATORY_CARE_PROVIDER_SITE_OTHER): Payer: Medicare Other

## 2020-01-27 DIAGNOSIS — I4891 Unspecified atrial fibrillation: Secondary | ICD-10-CM | POA: Diagnosis not present

## 2020-01-27 DIAGNOSIS — G459 Transient cerebral ischemic attack, unspecified: Secondary | ICD-10-CM | POA: Diagnosis not present

## 2020-02-13 DIAGNOSIS — I4891 Unspecified atrial fibrillation: Secondary | ICD-10-CM | POA: Diagnosis not present

## 2020-02-13 DIAGNOSIS — G459 Transient cerebral ischemic attack, unspecified: Secondary | ICD-10-CM | POA: Diagnosis not present

## 2020-02-21 DIAGNOSIS — E785 Hyperlipidemia, unspecified: Secondary | ICD-10-CM | POA: Insufficient documentation

## 2020-02-21 NOTE — Progress Notes (Signed)
Cardiology Office Note   Date:  02/22/2020   ID:  Linda Mays, Linda Mays [birth year], MRN [id number]  PCP:  Laurey Morale, MD  Cardiologist:   No primary care provider on file. Referring:  Laurey Morale, MD  Chief Complaint  Patient presents with  . Abdominal Pain      History of Present Illness: Linda Mays is a 79 y.o. female who is referred by Laurey Morale, MD for evaluation of TIA.  I saw her several years ago.  In the year when her symptoms were at that point but I do see that in 2014 she had an unremarkable echo.  She had renal Dopplers that were unremarkable.  She had a monitor that demonstrated some short runs of SVT and frequent PVCs.  She has not had any other cardiac work-up or problems.  She was in the hospital at [facility] in July 2021.  She had symptoms of right arm hip and knee numbness.  It was felt to be a TIA.  They did an extensive work-up with a negative MRI, negative DVT on venous Dopplers and no evidence of carotid stenosis.  She did have an echocardiogram that was mentioned to demonstrate a PFO with left atrial enlargement though I do not have these results.  She was sent home on aspirin and statin in addition to her other meds.  After the last visit she wore an event monitor that did not demonstrate atrial fib.  She did not want an implanted monitor.    Since I last saw her she has had cramping and abdominal discomfort that she ascribes to her Lipitor.  She stopped taking it for little while and is improved.  She has had some occasional palpitations but no sustained tachyarrhythmias.  She has had no symptoms previous bleeding similar to her right arm hip and knee numbness.  She has had no blurred vision or problems with her speech.  She denies any chest pressure, neck or arm discomfort.  She has no shortness of breath, PND or orthopnea.  She stays active.  Past Medical History:  Diagnosis Date  . Dyslipidemia   . GERD (gastroesophageal reflux disease)   .  History of colonic polyps   . Hypertension   . Hypothyroidism   . Osteoarthritis   . Osteoporosis   . TIA (transient ischemic attack) 11/06/2019    Past Surgical History:  Procedure Laterality Date  . ABDOMINAL HYSTERECTOMY    . Bladder tack procedure    . BREAST LUMPECTOMY    . OOPHORECTOMY       Current Outpatient Medications  Medication Sig Dispense Refill  . amLODipine (NORVASC) 2.5 MG tablet Take 1 tablet (2.5 mg total) by mouth at bedtime. 90 tablet 3  . aspirin EC 81 MG tablet Take 81 mg by mouth daily. Swallow whole.    . metoprolol succinate (TOPROL-XL) 100 MG 24 hr tablet Take 1 tablet (100 mg total) by mouth 2 (two) times daily. Take with or immediately following a meal. 180 tablet 3  . SYNTHROID 50 MCG tablet Take 1 tablet (50 mcg total) by mouth daily. 90 tablet 3  . rosuvastatin (CRESTOR) 20 MG tablet Take 1 tablet (20 mg total) by mouth daily. 90 tablet 3   No current facility-administered medications for this visit.    Allergies:   Lisinopril-hydrochlorothiazide, Ace inhibitors, Doxycycline, Iophen c-nr [guaifenesin-codeine], Penicillins, and Sulfonamide derivatives    ROS:  Please see the history of present illness.   Otherwise,  review of systems are positive for back spasm.   All other systems are reviewed and negative.    PHYSICAL EXAM: VS:  BP 137/64   Pulse 66   Ht 5\' 2"  (1.575 m)   Wt 123 lb 6.4 oz (56 kg)   SpO2 100%   BMI 22.57 kg/m  , BMI Body mass index is 22.57 kg/m. GENERAL:  Well appearing NECK:  No jugular venous distention, waveform within normal limits, carotid upstroke brisk and symmetric, no bruits, no thyromegaly LUNGS:  Clear to auscultation bilaterally CHEST:  Unremarkable HEART:  PMI not displaced or sustained,S1 and S2 within normal limits, no S3, no S4, no clicks, no rubs, no murmurs ABD:  Flat, positive bowel sounds normal in frequency in pitch, no bruits, no rebound, no guarding, no midline pulsatile mass, no hepatomegaly, no  splenomegaly EXT:  2 plus pulses throughout, no edema, no cyanosis no clubbing   EKG:  EKG is not ordered today.    Recent Labs: No results found for requested labs within last 8760 hours.    Lipid Panel    Component Value Date/Time   CHOL 191 06/15/2018 1026   TRIG 95.0 06/15/2018 1026   HDL 53.70 06/15/2018 1026   CHOLHDL 4 06/15/2018 1026   VLDL 19.0 06/15/2018 1026   LDLCALC 119 (H) 06/15/2018 1026   LDLDIRECT 168.9 04/01/2012 1131      Wt Readings from Last 3 Encounters:  02/22/20 123 lb 6.4 oz (56 kg)  01/11/20 123 lb 3.2 oz (55.9 kg)  11/17/19 123 lb (55.8 kg)      Other studies Reviewed: Additional studies/ records that were reviewed today include: None Review of the above records demonstrates:  NA   ASSESSMENT AND PLAN:  TIA: No evidence of atrial fib.  She will continue current therapy.   I would suggest continuing an aspirin we had this conversation.  If she has further arrhythmias she understands we need to try to get an EKG.  No indication for DOAC at this point.  HTN:   Blood pressure is well controlled.  She will continue the meds as listed.  DYSLIPIDEMIA:    She is not tolerating the Lipitor and I will try Crestor 20 mg daily with a lipid profile and liver enzymes in about 10 weeks if she is on this medication.   COVID EDUCATION:   She has had the vaccine.  We talked about the booster shot and she is going to get this.  Current medicines are reviewed at length with the patient today.  The patient does not have concerns regarding medicines.  The following changes have been made:  no change  Labs/ tests ordered today include:   Orders Placed This Encounter  Procedures  . Lipid panel  . Hepatic function panel     Disposition:   FU with me in 12 months.     Signed, Minus Breeding, MD  02/22/2020 10:34 AM    [facility]

## 2020-02-22 ENCOUNTER — Other Ambulatory Visit: Payer: Self-pay

## 2020-02-22 ENCOUNTER — Ambulatory Visit (INDEPENDENT_AMBULATORY_CARE_PROVIDER_SITE_OTHER): Payer: Medicare Other | Admitting: Cardiology

## 2020-02-22 ENCOUNTER — Encounter: Payer: Self-pay | Admitting: Cardiology

## 2020-02-22 VITALS — BP 137/64 | HR 66 | Ht 62.0 in | Wt 123.4 lb

## 2020-02-22 DIAGNOSIS — G459 Transient cerebral ischemic attack, unspecified: Secondary | ICD-10-CM

## 2020-02-22 DIAGNOSIS — I10 Essential (primary) hypertension: Secondary | ICD-10-CM

## 2020-02-22 DIAGNOSIS — E785 Hyperlipidemia, unspecified: Secondary | ICD-10-CM | POA: Diagnosis not present

## 2020-02-22 MED ORDER — ROSUVASTATIN CALCIUM 20 MG PO TABS: 20.0000 mg | ORAL_TABLET | Freq: Every day | ORAL | 3 refills | Status: DC

## 2020-02-22 NOTE — Patient Instructions (Signed)
Medication Instructions:  STOP taking your lipitor START taking Crestor 20 mg once a day *If you need a refill on your cardiac medications before your next appointment, please call your pharmacy*   Lab Work: LIPID PROFILE AND LIVER ENZYMES FASTING IN 10 WEEKS If you have labs (blood work) drawn today and your tests are completely normal, you will receive your results only by: Marland Kitchen MyChart Message (if you have MyChart) OR . A paper copy in the mail If you have any lab test that is abnormal or we need to change your treatment, we will call you to review the results.   Testing/Procedures: None ordered    Follow-Up: At [facility], you and your health needs are our priority.  As part of our continuing mission to provide you with exceptional heart care, we have created designated Provider Care Teams.  These Care Teams include your primary Cardiologist (physician) and Advanced Practice Providers (APPs -  Physician Assistants and Nurse Practitioners) who all work together to provide you with the care you need, when you need it.  We recommend signing up for the patient portal called "MyChart".  Sign up information is provided on this After Visit Summary.  MyChart is used to connect with patients for Virtual Visits (Telemedicine).  Patients are able to view lab/test results, encounter notes, upcoming appointments, etc.  Non-urgent messages can be sent to your provider as well.   To learn more about what you can do with MyChart, go to [email or web address].    Your next appointment:   12 month(s)  The format for your next appointment:   In Person  Provider:   Dr. Minus Breeding, MD   Other Instructions None

## 2020-04-01 DIAGNOSIS — Z23 Encounter for immunization: Secondary | ICD-10-CM | POA: Diagnosis not present

## 2020-05-08 LAB — HEPATIC FUNCTION PANEL
ALT: 99 IU/L — ABNORMAL HIGH (ref 0–32)
AST: 48 IU/L — ABNORMAL HIGH (ref 0–40)
Albumin: 4.5 g/dL (ref 3.6–4.6)
Alkaline Phosphatase: 120 IU/L (ref 44–121)
Bilirubin Total: 0.6 mg/dL (ref 0.0–1.2)
Bilirubin, Direct: 0.16 mg/dL (ref 0.00–0.40)
Total Protein: 7 g/dL (ref 6.0–8.5)

## 2020-05-08 LAB — LIPID PANEL
Chol/HDL Ratio: 2.4 ratio (ref 0.0–4.4)
Cholesterol, Total: 154 mg/dL (ref 100–199)
HDL: 64 mg/dL (ref >39)
LDL Chol Calc (NIH): 75 mg/dL (ref 0–99)
Triglycerides: 78 mg/dL (ref 0–149)
VLDL Cholesterol Cal: 15 mg/dL (ref 5–40)

## 2020-05-14 ENCOUNTER — Ambulatory Visit: Payer: Medicare Other

## 2020-05-28 ENCOUNTER — Telehealth: Payer: Self-pay | Admitting: Family Medicine

## 2020-05-28 NOTE — Telephone Encounter (Signed)
Form has been placed in red nurses folder for Dr Sarajane Jews to sign.

## 2020-05-28 NOTE — Telephone Encounter (Signed)
I received the fax and placed in Dr. Sarajane Jews red folder.

## 2020-05-28 NOTE — Telephone Encounter (Signed)
The form is ready  

## 2020-05-28 NOTE — Telephone Encounter (Signed)
They sent a fax on 05/27/2020 and wanted to let Dr. Sarajane Jews know that the patient is waiting.  Contact Andy at [phone]

## 2020-05-29 NOTE — Telephone Encounter (Signed)
Form has been faxed successfully

## 2020-06-11 ENCOUNTER — Telehealth: Payer: Self-pay | Admitting: Family Medicine

## 2020-06-11 MED ORDER — METOPROLOL SUCCINATE ER 100 MG PO TB24
100.0000 mg | ORAL_TABLET | Freq: Two times a day (BID) | ORAL | 0 refills | Status: DC
Start: 2020-06-11 — End: 2020-09-13

## 2020-06-11 MED ORDER — SYNTHROID 50 MCG PO TABS: 50.0000 ug | ORAL_TABLET | Freq: Every day | ORAL | 0 refills | Status: DC

## 2020-06-11 NOTE — Telephone Encounter (Signed)
RX sent in for the metoprolol and the synthroid #90, 0 rfs.  The other didn't need refills.  Patient notified [place] phone. Dm/cma

## 2020-06-11 NOTE — Telephone Encounter (Signed)
Pt would like a refill for these 3 prescriptions:  amLODipine (NORVASC) 2.5 MG tablet metoprolol succinate (TOPROL-XL) 100 MG 24 hr tablet SYNTHROID 50 MCG tablet  Send AY:[id number] [id number]  [place], [place] [address]  Phone:  [phone] Fax:  [phone]

## 2020-06-18 ENCOUNTER — Ambulatory Visit: Payer: Medicare Other

## 2020-08-06 ENCOUNTER — Other Ambulatory Visit: Payer: Self-pay

## 2020-08-06 ENCOUNTER — Ambulatory Visit (INDEPENDENT_AMBULATORY_CARE_PROVIDER_SITE_OTHER): Payer: Medicare Other

## 2020-08-06 DIAGNOSIS — Z0000 Encounter for general adult medical examination without abnormal findings: Secondary | ICD-10-CM

## 2020-08-06 NOTE — Patient Instructions (Addendum)
Linda Mays , Thank you for taking time to come for your Medicare Wellness Visit. I appreciate your ongoing commitment to your health goals. Please review the following plan we discussed and let me know if I can assist you in the future.   Screening recommendations/referrals: Colonoscopy: No longer needed Mammogram: Declined at this time Bone Density: Declined at this time Recommended yearly ophthalmology/optometry visit for glaucoma screening and checkup Recommended yearly dental visit for hygiene and checkup  Vaccinations: Influenza vaccine: Up to date Pneumococcal vaccine: Declined and discussed Tdap vaccine: Declined and discussed Shingles vaccine: Shingrix discussed. Please contact your pharmacy for coverage information.    Covid-19:Comlpleted 1/25, 2/19, & 02/23/20  Advanced directives: Advance directive discussed with you today. Even though you declined this today please call our office should you change your mind and we can give you the proper paperwork for you to fill out.  Conditions/risks identified: None at this time  Next appointment: Follow up in one year for your annual wellness visit    Preventive Care 65 Years and Older, Female Preventive care refers to lifestyle choices and visits with your health care provider that can promote health and wellness. What does preventive care include?  A yearly physical exam. This is also called an annual well check.  Dental exams once or twice a year.  Routine eye exams. Ask your health care provider how often you should have your eyes checked.  Personal lifestyle choices, including:  Daily care of your teeth and gums.  Regular physical activity.  Eating a healthy diet.  Avoiding tobacco and drug use.  Limiting alcohol use.  Practicing safe sex.  Taking low-dose aspirin every day.  Taking vitamin and mineral supplements as recommended by your health care provider. What happens during an annual well check? The  services and screenings done by your health care provider during your annual well check will depend on your age, overall health, lifestyle risk factors, and family history of disease. Counseling  Your health care provider may ask you questions about your:  Alcohol use.  Tobacco use.  Drug use.  Emotional well-being.  Home and relationship well-being.  Sexual activity.  Eating habits.  History of falls.  Memory and ability to understand (cognition).  Work and work Statistician.  Reproductive health. Screening  You may have the following tests or measurements:  Height, weight, and BMI.  Blood pressure.  Lipid and cholesterol levels. These may be checked every 5 years, or more frequently if you are over 15 years old.  Skin check.  Lung cancer screening. You may have this screening every year starting at age 10 if you have a 30-pack-year history of smoking and currently smoke or have quit within the past 15 years.  Fecal occult blood test (FOBT) of the stool. You may have this test every year starting at age 82.  Flexible sigmoidoscopy or colonoscopy. You may have a sigmoidoscopy every 5 years or a colonoscopy every 10 years starting at age 82.  Hepatitis C blood test.  Hepatitis B blood test.  Sexually transmitted disease (STD) testing.  Diabetes screening. This is done by checking your blood sugar (glucose) after you have not eaten for a while (fasting). You may have this done every 1-3 years.  Bone density scan. This is done to screen for osteoporosis. You may have this done starting at age 82.  Mammogram. This may be done every 1-2 years. Talk to your health care provider about how often you should have regular mammograms. Talk  with your health care provider about your test results, treatment options, and if necessary, the need for more tests. Vaccines  Your health care provider may recommend certain vaccines, such as:  Influenza vaccine. This is recommended  every year.  Tetanus, diphtheria, and acellular pertussis (Tdap, Td) vaccine. You may need a Td booster every 10 years.  Zoster vaccine. You may need this after age 11.  Pneumococcal 13-valent conjugate (PCV13) vaccine. One dose is recommended after age 34.  Pneumococcal polysaccharide (PPSV23) vaccine. One dose is recommended after age 82. Talk to your health care provider about which screenings and vaccines you need and how often you need them. This information is not intended to replace advice given to you by your health care provider. Make sure you discuss any questions you have with your health care provider. Document Released: 05/10/2015 Document Revised: 01/01/2016 Document Reviewed: 02/12/2015 Elsevier Interactive Patient Education  2017 [place] Prevention in the Home Falls can cause injuries. They can happen to people of all ages. There are many things you can do to make your home safe and to help prevent falls. What can I do on the outside of my home?  Regularly fix the edges of walkways and driveways and fix any cracks.  Remove anything that might make you trip as you walk through a door, such as a raised step or threshold.  Trim any bushes or trees on the path to your home.  Use bright outdoor lighting.  Clear any walking paths of anything that might make someone trip, such as rocks or tools.  Regularly check to see if handrails are loose or broken. Make sure that both sides of any steps have handrails.  Any raised decks and porches should have guardrails on the edges.  Have any leaves, snow, or ice cleared regularly.  Use sand or salt on walking paths during winter.  Clean up any spills in your garage right away. This includes oil or grease spills. What can I do in the bathroom?  Use night lights.  Install grab bars by the toilet and in the tub and shower. Do not use towel bars as grab bars.  Use non-skid mats or decals in the tub or shower.  If  you need to sit down in the shower, use a plastic, non-slip stool.  Keep the floor dry. Clean up any water that spills on the floor as soon as it happens.  Remove soap buildup in the tub or shower regularly.  Attach bath mats securely with double-sided non-slip rug tape.  Do not have throw rugs and other things on the floor that can make you trip. What can I do in the bedroom?  Use night lights.  Make sure that you have a light by your bed that is easy to reach.  Do not use any sheets or blankets that are too big for your bed. They should not hang down onto the floor.  Have a firm chair that has side arms. You can use this for support while you get dressed.  Do not have throw rugs and other things on the floor that can make you trip. What can I do in the kitchen?  Clean up any spills right away.  Avoid walking on wet floors.  Keep items that you use a lot in easy-to-reach places.  If you need to reach something above you, use a strong step stool that has a grab bar.  Keep electrical cords out of the way.  Do  not use floor polish or wax that makes floors slippery. If you must use wax, use non-skid floor wax.  Do not have throw rugs and other things on the floor that can make you trip. What can I do with my stairs?  Do not leave any items on the stairs.  Make sure that there are handrails on both sides of the stairs and use them. Fix handrails that are broken or loose. Make sure that handrails are as long as the stairways.  Check any carpeting to make sure that it is firmly attached to the stairs. Fix any carpet that is loose or worn.  Avoid having throw rugs at the top or bottom of the stairs. If you do have throw rugs, attach them to the floor with carpet tape.  Make sure that you have a light switch at the top of the stairs and the bottom of the stairs. If you do not have them, ask someone to add them for you. What else can I do to help prevent falls?  Wear shoes  that:  Do not have high heels.  Have rubber bottoms.  Are comfortable and fit you well.  Are closed at the toe. Do not wear sandals.  If you use a stepladder:  Make sure that it is fully opened. Do not climb a closed stepladder.  Make sure that both sides of the stepladder are locked into place.  Ask someone to hold it for you, if possible.  Clearly mark and make sure that you can see:  Any grab bars or handrails.  First and last steps.  Where the edge of each step is.  Use tools that help you move around (mobility aids) if they are needed. These include:  Canes.  Walkers.  Scooters.  Crutches.  Turn on the lights when you go into a dark area. Replace any light bulbs as soon as they burn out.  Set up your furniture so you have a clear path. Avoid moving your furniture around.  If any of your floors are uneven, fix them.  If there are any pets around you, be aware of where they are.  Review your medicines with your doctor. Some medicines can make you feel dizzy. This can increase your chance of falling. Ask your doctor what other things that you can do to help prevent falls. This information is not intended to replace advice given to you by your health care provider. Make sure you discuss any questions you have with your health care provider. Document Released: 02/07/2009 Document Revised: 09/19/2015 Document Reviewed: 05/18/2014 Elsevier Interactive Patient Education  2017 [employer].

## 2020-08-06 NOTE — Progress Notes (Signed)
Virtual Visit via Telephone Note  I connected with  Linda Mays on 08/06/20 at 10:15 AM EDT by telephone and verified that I am speaking with the correct person using two identifiers.  Medicare Annual Wellness visit completed telephonically due to Covid-19 pandemic.   Persons participating in this call: This Health Coach and this patient.   Location: Patient: Home Provider: Office    I discussed the limitations, risks, security and privacy concerns of performing an evaluation and management service by telephone and the availability of in person appointments. The patient expressed understanding and agreed to proceed.  Unable to perform video visit due to video visit attempted and failed and/or patient does not have video capability.   Some vital signs may be absent or patient reported.   Willette Brace, LPN    Subjective:   Linda Mays is a 80 y.o. female who presents for an Initial Medicare Annual Wellness Visit.  Review of Systems     Cardiac Risk Factors include: advanced age (>28men, >89 women);hypertension;dyslipidemia     Objective:    There were no vitals filed for this visit. There is no height or weight on file to calculate BMI.  Advanced Directives 08/06/2020  Does Patient Have a Medical Advance Directive? No  Would patient like information on creating a medical advance directive? No - Patient declined    Current Medications (verified) Outpatient Encounter Medications as of 08/06/2020  Medication Sig  . amLODipine (NORVASC) 2.5 MG tablet Take 1 tablet (2.5 mg total) by mouth at bedtime.  Marland Kitchen aspirin EC 81 MG tablet Take 81 mg by mouth daily. Swallow whole.  . metoprolol succinate (TOPROL-XL) 100 MG 24 hr tablet Take 1 tablet (100 mg total) by mouth 2 (two) times daily. Take with or immediately following a meal.  . rosuvastatin (CRESTOR) 20 MG tablet Take 1 tablet (20 mg total) by mouth daily.  Marland Kitchen SYNTHROID 50 MCG tablet Take 1 tablet (50 mcg total) by mouth  daily.   No facility-administered encounter medications on file as of 08/06/2020.    Allergies (verified) Lisinopril-hydrochlorothiazide, Ace inhibitors, Doxycycline, Iophen c-nr [guaifenesin-codeine], Penicillins, and Sulfonamide derivatives   History: Past Medical History:  Diagnosis Date  . Dyslipidemia   . GERD (gastroesophageal reflux disease)   . History of colonic polyps   . Hypertension   . Hypothyroidism   . Osteoarthritis   . Osteoporosis   . TIA (transient ischemic attack) 11/06/2019   Past Surgical History:  Procedure Laterality Date  . ABDOMINAL HYSTERECTOMY    . Bladder tack procedure    . BREAST LUMPECTOMY    . OOPHORECTOMY     Family History  Problem Relation Age of Onset  . Colon cancer Other        1st degree relative<60  . Diabetes Other        1st degree relative  . Hypertension Other   . Prostate cancer Other        1st degree relative <50  . Diverticulitis Mother   . CAD Father 12   Social History   Socioeconomic History  . Marital status: Divorced    Spouse name: Not on file  . Number of children: 2  . Years of education: Not on file  . Highest education level: Not on file  Occupational History  . Occupation: Retired    Fish farm manager: RETIRED  Tobacco Use  . Smoking status: Never Smoker  . Smokeless tobacco: Never Used  Substance and Sexual Activity  . Alcohol use:  No    Alcohol/week: 0.0 standard drinks  . Drug use: No  . Sexual activity: Not on file  Other Topics Concern  . Not on file  Social History Narrative   Lives at home by herself   Social Determinants of Health   Financial Resource Strain: Low Risk   . Difficulty of Paying Living Expenses: Not hard at all  Food Insecurity: No Food Insecurity  . Worried About Charity fundraiser in the Last Year: Never true  . Ran Out of Food in the Last Year: Never true  Transportation Needs: No Transportation Needs  . Lack of Transportation (Medical): No  . Lack of Transportation  (Non-Medical): No  Physical Activity: Inactive  . Days of Exercise per Week: 0 days  . Minutes of Exercise per Session: 0 min  Stress: No Stress Concern Present  . Feeling of Stress : Not at all  Social Connections: Moderately Isolated  . Frequency of Communication with Friends and Family: More than three times a week  . Frequency of Social Gatherings with Friends and Family: More than three times a week  . Attends Religious Services: More than 4 times per year  . Active Member of Clubs or Organizations: No  . Attends Archivist Meetings: Never  . Marital Status: Divorced    Tobacco Counseling Counseling given: Not Answered   Clinical Intake:  Pre-visit preparation completed: Yes  Pain : No/denies pain     BMI - recorded: 22.57 Nutritional Status: BMI of 19-24  Normal Nutritional Risks: None Diabetes: No  How often do you need to have someone help you when you read instructions, pamphlets, or other written materials from your doctor or pharmacy?: 1 - Never  Diabetic?No  Interpreter Needed?: No  Information entered by :: Charlott Rakes, LPN   Activities of Daily Living In your present state of health, do you have any difficulty performing the following activities: 08/06/2020  Hearing? Y  Comment stated hearing loss  Vision? N  Difficulty concentrating or making decisions? N  Walking or climbing stairs? N  Dressing or bathing? N  Doing errands, shopping? N  Preparing Food and eating ? N  Using the Toilet? N  In the past six months, have you accidently leaked urine? N  Do you have problems with loss of bowel control? N  Managing your Medications? N  Managing your Finances? N  Housekeeping or managing your Housekeeping? N  Some recent data might be hidden    Patient Care Team: Laurey Morale, MD as PCP - General  Indicate any recent Medical Services you may have received from other than [facility] providers in the past year (date may be approximate).      Assessment:   This is a routine wellness examination for [name].  Hearing/Vision screen  Hearing Screening   125Hz  250Hz  500Hz  1000Hz  2000Hz  3000Hz  4000Hz  6000Hz  8000Hz   Right ear:           Left ear:           Comments: States hearing loss no hearing aids at this Time   Vision Screening Comments: No exams since covid will need to make appt with provider in [place]   Dietary issues and exercise activities discussed: Current Exercise Habits: The patient does not participate in regular exercise at present  Goals    . Patient Stated     None at this time      Depression Screen PHQ 2/9 Scores 08/06/2020 05/30/2015 04/04/2014  PHQ -  2 Score 0 0 0    Fall Risk Fall Risk  08/06/2020 11/17/2019 11/25/2018 05/30/2015 04/04/2014  Falls in the past year? 0 0 (No Data) No No  Comment - - Emmi Telephone Survey: data to providers prior to load - -  Number falls in past yr: 0 0 (No Data) - -  Comment - - Emmi Telephone Survey Actual Response =  - -  Injury with Fall? 0 0 - - -  Risk for fall due to : Impaired vision;Impaired balance/gait - - - -  Risk for fall due to: Comment not as good as it use to be in balance - - - -  Follow up Falls prevention discussed - - - -    FALL RISK PREVENTION PERTAINING TO THE HOME:  Any stairs in or around the home? No  If so, are there any without handrails? No  Home free of loose throw rugs in walkways, pet beds, electrical cords, etc? Yes  Adequate lighting in your home to reduce risk of falls? Yes   ASSISTIVE DEVICES UTILIZED TO PREVENT FALLS:  Life alert? No  Use of a cane, walker or w/c? No  Grab bars in the bathroom? Yes  Shower chair or bench in shower? No  Elevated toilet seat or a handicapped toilet? No   TIMED UP AND GO:  Was the test performed? No      Cognitive Function:     6CIT Screen 08/06/2020  What Year? 0 points  What month? 0 points  Count back from 20 0 points  Months in reverse 0 points  Repeat phrase 6 points     Immunizations Immunization History  Administered Date(s) Administered  . Influenza Split 03/26/2011, 03/01/2012  . Influenza Whole 02/07/2008, 01/30/2009, 01/22/2010  . Influenza, High Dose Seasonal PF 04/01/2020  . Influenza,inj,Quad PF,6+ Mos 02/14/2013, 02/20/2014, 01/30/2015, 02/11/2016, 03/01/2017, 02/16/2018, 02/27/2019  . Moderna Sars-Covid-2 Vaccination 05/22/2019, 06/16/2019, 02/23/2020    TDAP status: Due, Education has been provided regarding the importance of this vaccine. Advised may receive this vaccine at local pharmacy or Health Dept. Aware to provide a copy of the vaccination record if obtained from local pharmacy or Health Dept. Verbalized acceptance and understanding.  Flu Vaccine status: Up to date  Pneumococcal vaccine status: Declined,  Education has been provided regarding the importance of this vaccine but patient still declined. Advised may receive this vaccine at local pharmacy or Health Dept. Aware to provide a copy of the vaccination record if obtained from local pharmacy or Health Dept. Verbalized acceptance and understanding.   Covid-19 vaccine status: Completed vaccines  Qualifies for Shingles Vaccine? Yes   Zostavax completed No   Shingrix Completed?: No.    Education has been provided regarding the importance of this vaccine. Patient has been advised to call insurance company to determine out of pocket expense if they have not yet received this vaccine. Advised may also receive vaccine at local pharmacy or Health Dept. Verbalized acceptance and understanding.  Screening Tests Health Maintenance  Topic Date Due  . DEXA SCAN  08/06/2021 (Originally 11/11/2003)  . TETANUS/TDAP  08/06/2021 (Originally 11/10/1959)  . PNA vac Low Risk Adult (1 of 2 - PCV13) 08/06/2021 (Originally 11/11/2003)  . INFLUENZA VACCINE  11/25/2020  . COVID-19 Vaccine  Completed  . HPV VACCINES  Aged Out    Health Maintenance  There are no preventive care reminders to display  for this patient.  Colorectal cancer screening: No longer required.   Mammogram status: No longer required  due to age .  Bone density : Declined at this time    Additional Screening:  Vision Screening: Recommended annual ophthalmology exams for early detection of glaucoma and other disorders of the eye. Is the patient up to date with their annual eye exam?  No  Who is the provider or what is the name of the office in which the patient attends annual eye exams? [facility] provider  If pt is not established with a provider, would they like to be referred to a provider to establish care? No .   Dental Screening: Recommended annual dental exams for proper oral hygiene  Community Resource Referral / Chronic Care Management: CRR required this visit?  No   CCM required this visit?  No      Plan:     I have personally reviewed and noted the following in the patient's chart:   . Medical and social history . Use of alcohol, tobacco or illicit drugs  . Current medications and supplements . Functional ability and status . Nutritional status . Physical activity . Advanced directives . List of other physicians . Hospitalizations, surgeries, and ER visits in previous 12 months . Vitals . Screenings to include cognitive, depression, and falls . Referrals and appointments  In addition, I have reviewed and discussed with patient certain preventive protocols, quality metrics, and best practice recommendations. A written personalized care plan for preventive services as well as general preventive health recommendations were provided to patient.     Willette Brace, LPN   [id number]   Nurse Notes: None

## 2020-09-01 IMAGING — DX DG THORACIC SPINE 2V
2 series · 2 of 2 positions shown · non-contrast
Comparison: None.

CLINICAL DATA: Right-sided thoracic spine pain.

EXAM:
THORACIC SPINE 2 VIEWS

[thoracic spine ap]
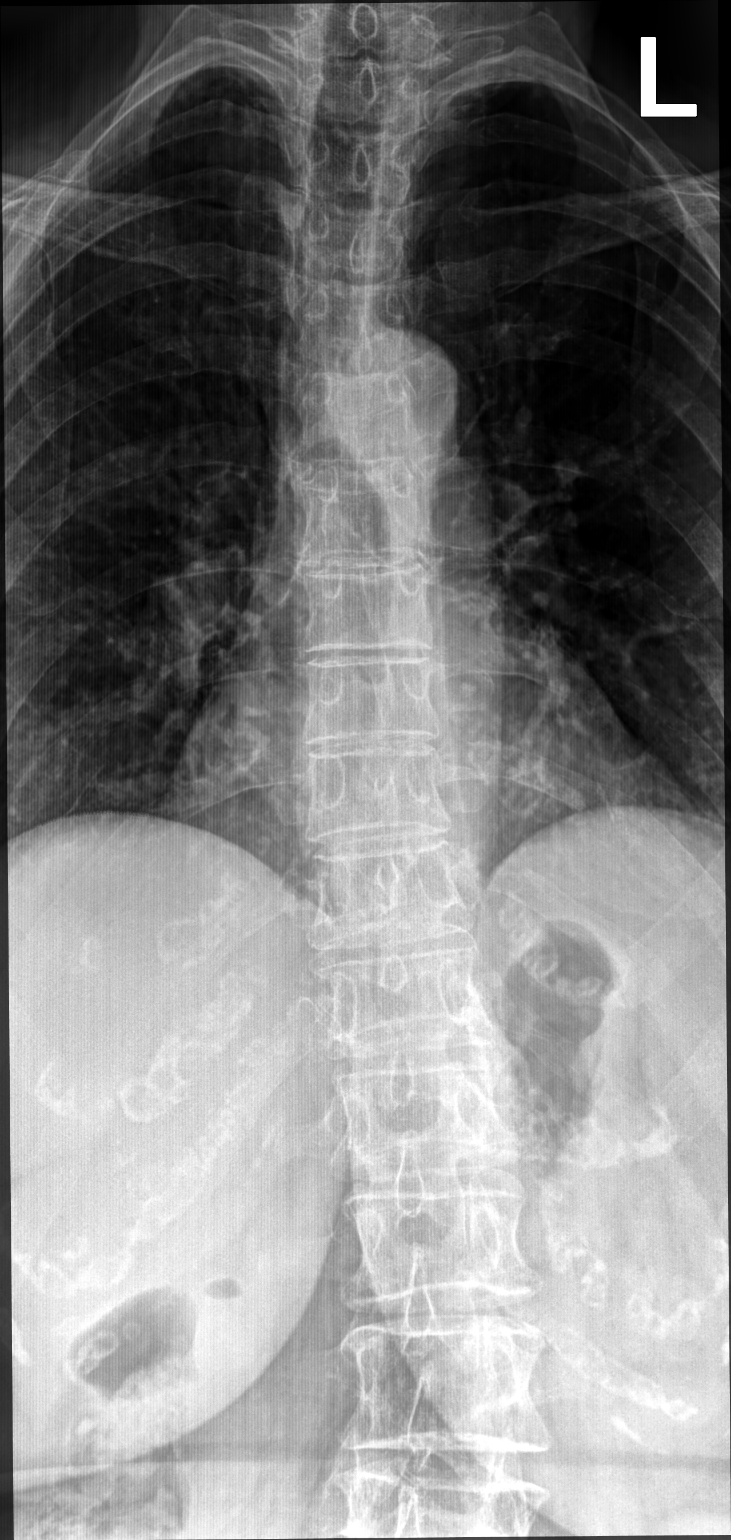

[thoracic spine lat]
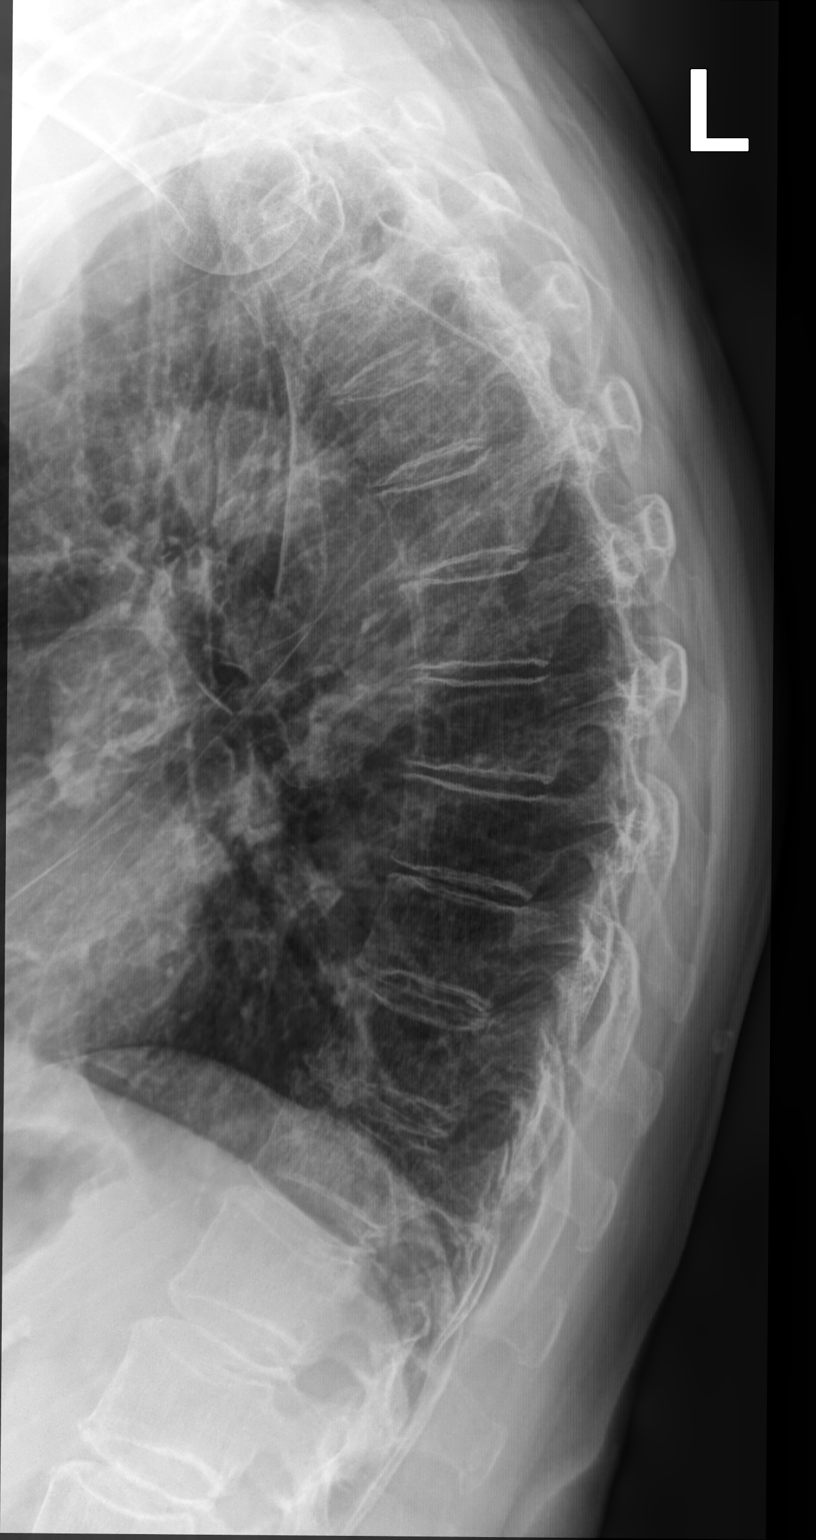

[2 of 2 positions shown; findings below may reference images not displayed]

FINDINGS: There is no evidence of thoracic spine fracture. Alignment is
normal. No other significant bone abnormalities are identified.
IMPRESSION: Negative.

## 2020-09-03 DIAGNOSIS — S30811A Abrasion of abdominal wall, initial encounter: Secondary | ICD-10-CM | POA: Diagnosis not present

## 2020-09-11 ENCOUNTER — Telehealth: Payer: Self-pay | Admitting: Family Medicine

## 2020-09-11 NOTE — Telephone Encounter (Signed)
Pt is calling in needing a refill on Rx's metoprolol succinate (TOPROL-XL) 100 MG, Synthroid 50 MCG and amlodipine (NORVASC) 2.5 MG  Pharm:  [facility] in [place], [place]

## 2020-09-12 NOTE — Telephone Encounter (Signed)
Last office visit-11/17/2019 Last TSH labs- 06/15/2018  Can this patient receive requested refills?

## 2020-09-13 ENCOUNTER — Other Ambulatory Visit: Payer: Self-pay

## 2020-09-13 MED ORDER — SYNTHROID 50 MCG PO TABS: 50.0000 ug | ORAL_TABLET | Freq: Every day | ORAL | 3 refills | Status: DC

## 2020-09-13 MED ORDER — METOPROLOL SUCCINATE ER 100 MG PO TB24: 100.0000 mg | ORAL_TABLET | Freq: Two times a day (BID) | ORAL | 3 refills | Status: DC

## 2020-09-13 MED ORDER — AMLODIPINE BESYLATE 2.5 MG PO TABS: 2.5000 mg | ORAL_TABLET | Freq: Every day | ORAL | 3 refills | Status: DC

## 2020-09-13 NOTE — Telephone Encounter (Signed)
Please refill these for one year  

## 2020-09-13 NOTE — Telephone Encounter (Signed)
Called patient lvm informing medication has been sent to pharmacy.

## 2020-09-16 ENCOUNTER — Other Ambulatory Visit: Payer: Self-pay

## 2020-09-17 ENCOUNTER — Other Ambulatory Visit: Payer: Self-pay

## 2020-09-18 ENCOUNTER — Ambulatory Visit (INDEPENDENT_AMBULATORY_CARE_PROVIDER_SITE_OTHER): Payer: Medicare Other | Admitting: Family Medicine

## 2020-09-18 ENCOUNTER — Encounter: Payer: Self-pay | Admitting: Family Medicine

## 2020-09-18 VITALS — BP 136/80 | HR 70 | Temp 98.8°F | Wt 127.0 lb

## 2020-09-18 DIAGNOSIS — I10 Essential (primary) hypertension: Secondary | ICD-10-CM

## 2020-09-18 DIAGNOSIS — M722 Plantar fascial fibromatosis: Secondary | ICD-10-CM

## 2020-09-18 MED ORDER — AMLODIPINE BESYLATE 2.5 MG PO TABS: 5.0000 mg | ORAL_TABLET | Freq: Every morning | ORAL | 3 refills | Status: DC

## 2020-09-18 NOTE — Progress Notes (Signed)
   Subjective:    Patient ID: Linda Mays, female    DOB: Sep 10, 1940, 80 y.o.   MRN: [id number]  HPI Here for several concerns. First about 2 weeks ago her BP started to go up in the evenings. Her morning BP has remained well controlled with averages in the 120s over 80s. Then in the evenings the BP has been as high as 160/100. When this happens she sometimes gets a headache, but no chest pain or SOB. She takes Amlodipine 2.5 mg in the mornings and Metoprolol succinate 100 mg in the evenings. Also for the past month she has had frequent pains in the bottom of the left heel. No recent trauma. No swelling. She tends to wear bedroom slippers around the house.    Review of Systems  Constitutional: Negative.   Respiratory: Negative.   Cardiovascular: Negative.   Musculoskeletal: Positive for myalgias.  Neurological: Positive for headaches.       Objective:   Physical Exam Constitutional:      Appearance: Normal appearance.  Cardiovascular:     Rate and Rhythm: Normal rate and regular rhythm.     Pulses: Normal pulses.     Heart sounds: Normal heart sounds.  Pulmonary:     Effort: Pulmonary effort is normal.     Breath sounds: Normal breath sounds.  Musculoskeletal:     Right lower leg: No edema.     Left lower leg: No edema.     Comments: She is tender on the inferior surface of the left heel pad , no swelling   Neurological:     Mental Status: She is alert.           Assessment & Plan:  For the HTN, she will increase the Amlodipine to take 2 pills (total of 5  Mg) every morning and keep the Metoprolol the same at night.  Report back in 2 weeks. She also has some plantar fasciitis, and I asked her to wear supportive shoes (such as sneakers) around the house. Recheck as needed. We spent 40 minutes discussing these issues.  Alysia Penna, MD

## 2020-11-15 ENCOUNTER — Telehealth: Payer: Self-pay | Admitting: Family Medicine

## 2020-11-15 MED ORDER — AMLODIPINE BESYLATE 2.5 MG PO TABS: 5.0000 mg | ORAL_TABLET | Freq: Every morning | ORAL | 3 refills | Status: DC

## 2020-11-15 NOTE — Telephone Encounter (Signed)
RX sent

## 2020-11-15 NOTE — Telephone Encounter (Signed)
Pt call and  stated sheneed a refill on amLODipine (NORVASC) 2.5 MG tablet  sent to  [place], [place] Phone:  [phone]  Fax:  [phone]

## 2020-11-18 ENCOUNTER — Ambulatory Visit (INDEPENDENT_AMBULATORY_CARE_PROVIDER_SITE_OTHER): Payer: Medicare Other | Admitting: Family Medicine

## 2020-11-18 ENCOUNTER — Encounter: Payer: Self-pay | Admitting: Family Medicine

## 2020-11-18 ENCOUNTER — Other Ambulatory Visit: Payer: Self-pay

## 2020-11-18 VITALS — BP 142/90 | Temp 98.5°F | Ht 62.0 in | Wt 124.0 lb

## 2020-11-18 DIAGNOSIS — R300 Dysuria: Secondary | ICD-10-CM

## 2020-11-18 DIAGNOSIS — N390 Urinary tract infection, site not specified: Secondary | ICD-10-CM

## 2020-11-18 LAB — POCT URINALYSIS DIPSTICK
Bilirubin, UA: NEGATIVE
Blood, UA: NEGATIVE
Glucose, UA: NEGATIVE
Ketones, UA: NEGATIVE
Leukocytes, UA: NEGATIVE
Nitrite, UA: NEGATIVE
Protein, UA: NEGATIVE
Spec Grav, UA: 1.01 (ref 1.010–1.025)
Urobilinogen, UA: 0.2 E.U./dL
pH, UA: 6 (ref 5.0–8.0)

## 2020-11-18 MED ORDER — CIPROFLOXACIN HCL 500 MG PO TABS: 500.0000 mg | ORAL_TABLET | Freq: Two times a day (BID) | ORAL | 0 refills | Status: AC

## 2020-11-18 NOTE — Patient Instructions (Signed)
Health Maintenance Due  Topic Date Due   Zoster Vaccines- Shingrix (1 of 2) Never done   COVID-19 Vaccine (4 - Booster for Moderna series) 05/25/2020    Depression screen PHQ 2/9 08/06/2020 05/30/2015 04/04/2014  Decreased Interest 0 0 0  Down, Depressed, Hopeless 0 0 0  PHQ - 2 Score 0 0 0

## 2020-11-18 NOTE — Progress Notes (Signed)
   Subjective:    Patient ID: Linda Mays, female    DOB: [birth year], 80 y.o.   MRN: [id number]  HPI Here for 2 days of urinary pressure and urgency and burning. No fever or nausea. Drinking lots of water.    Review of Systems  Constitutional: Negative.   Respiratory: Negative.    Cardiovascular: Negative.   Genitourinary:  Positive for dysuria, frequency and urgency. Negative for flank pain and hematuria.      Objective:   Physical Exam Constitutional:      Appearance: Normal appearance.  Cardiovascular:     Rate and Rhythm: Normal rate and regular rhythm.     Pulses: Normal pulses.     Heart sounds: Normal heart sounds.  Pulmonary:     Effort: Pulmonary effort is normal.     Breath sounds: Normal breath sounds.  Abdominal:     General: Abdomen is flat. Bowel sounds are normal. There is no distension.     Palpations: Abdomen is soft. There is no mass.     Tenderness: There is no abdominal tenderness. There is no right CVA tenderness, left CVA tenderness, guarding or rebound.     Hernia: No hernia is present.  Neurological:     Mental Status: She is alert.          Assessment & Plan:  UTI, treat with 7 days of Cipro. Culture the sample.  Alysia Penna, MD

## 2020-11-19 LAB — URINE CULTURE
MICRO NUMBER:: 12158444
Result:: NO GROWTH
SPECIMEN QUALITY:: ADEQUATE

## 2020-12-02 ENCOUNTER — Ambulatory Visit (INDEPENDENT_AMBULATORY_CARE_PROVIDER_SITE_OTHER): Payer: Medicare Other | Admitting: Otolaryngology

## 2020-12-02 ENCOUNTER — Other Ambulatory Visit: Payer: Self-pay

## 2020-12-02 DIAGNOSIS — H6123 Impacted cerumen, bilateral: Secondary | ICD-10-CM

## 2020-12-02 NOTE — Progress Notes (Signed)
HPI: Linda Mays is a 80 y.o. female who presents for evaluation of wax buildup in her ears..  Past Medical History:  Diagnosis Date   Dyslipidemia    GERD (gastroesophageal reflux disease)    History of colonic polyps    Hypertension    Hypothyroidism    Osteoarthritis    Osteoporosis    TIA (transient ischemic attack) 11/06/2019   Past Surgical History:  Procedure Laterality Date   ABDOMINAL HYSTERECTOMY     Bladder tack procedure     BREAST LUMPECTOMY     OOPHORECTOMY     Social History   Socioeconomic History   Marital status: Divorced    Spouse name: Not on file   Number of children: 2   Years of education: Not on file   Highest education level: Not on file  Occupational History   Occupation: Retired    Fish farm manager: RETIRED  Tobacco Use   Smoking status: Never   Smokeless tobacco: Never  Substance and Sexual Activity   Alcohol use: No    Alcohol/week: 0.0 standard drinks   Drug use: No   Sexual activity: Not on file  Other Topics Concern   Not on file  Social History Narrative   Lives at home by herself   Social Determinants of Health   Financial Resource Strain: Low Risk    Difficulty of Paying Living Expenses: Not hard at all  Food Insecurity: No Food Insecurity   Worried About Charity fundraiser in the Last Year: Never true   [place] in the Last Year: Never true  Transportation Needs: No Transportation Needs   Lack of Transportation (Medical): No   Lack of Transportation (Non-Medical): No  Physical Activity: Inactive   Days of Exercise per Week: 0 days   Minutes of Exercise per Session: 0 min  Stress: No Stress Concern Present   Feeling of Stress : Not at all  Social Connections: Moderately Isolated   Frequency of Communication with Friends and Family: More than three times a week   Frequency of Social Gatherings with Friends and Family: More than three times a week   Attends Religious Services: More than 4 times per year   Active Member  of [employer] or Organizations: No   Attends Music therapist: Never   Marital Status: Divorced   Family History  Problem Relation Age of Onset   Colon cancer Other        1st degree relative<60   Diabetes Other        1st degree relative   Hypertension Other    Prostate cancer Other        1st degree relative <50   Diverticulitis Mother    CAD Father 49   Allergies  Allergen Reactions   Lisinopril-Hydrochlorothiazide Other (See Comments)    Hot flashes   Ace Inhibitors     angioedema   Doxycycline    Iophen C-Nr [Guaifenesin-Codeine]    Penicillins    Sulfonamide Derivatives    Prior to Admission medications   Medication Sig Start Date End Date Taking? Authorizing Provider  amLODipine (NORVASC) 2.5 MG tablet Take 2 tablets (5 mg total) by mouth in the morning. 11/15/20   Laurey Morale, MD  aspirin EC 81 MG tablet Take 81 mg by mouth daily. Swallow whole.    [provider]  metoprolol succinate (TOPROL-XL) 100 MG 24 hr tablet Take 1 tablet (100 mg total) by mouth 2 (two) times daily. Take with  or immediately following a meal. 09/13/20   Laurey Morale, MD  rosuvastatin (CRESTOR) 20 MG tablet Take 1 tablet (20 mg total) by mouth daily. 02/22/20   Minus Breeding, MD  SYNTHROID 50 MCG tablet Take 1 tablet (50 mcg total) by mouth daily. 09/13/20   Laurey Morale, MD     Positive ROS: Otherwise negative  All other systems have been reviewed and were otherwise negative with the exception of those mentioned in the HPI and as above.  Physical Exam: Constitutional: Alert, well-appearing, no acute distress Ears: External ears without lesions or tenderness. Ear canals with a large amount of wax in both ear canals.  Of note she has small ear canals bilaterally.  The wax was removed with suction and forceps.  Of note on the right side some of the dry skin was removed with the wax and she had a little bit of bleeding on the right side that was controlled with cotton  pledget with adrenaline.  The TMs were clear bilaterally.  After cleaning the ears she heard much better in both ears.. Nasal: External nose without lesions. Clear nasal passages Oral: Oropharynx clear. Neck: No palpable adenopathy or masses Respiratory: Breathing comfortably  Skin: No facial/neck lesions or rash noted.  Cerumen impaction removal  Date/Time: 12/02/2020 5:38 PM Performed by: Rozetta Nunnery, MD Authorized by: Rozetta Nunnery, MD   Consent:    Consent obtained:  Verbal   Consent given by:  Patient   Risks discussed:  Pain and bleeding Procedure details:    Location:  L ear and R ear   Procedure type: curette, suction and forceps   Post-procedure details:    Inspection:  TM intact and canal normal   Hearing quality:  Improved   Procedure completion:  Tolerated well, no immediate complications Comments:     She has small ear canals bilaterally with a large amount of wax in both ear canals along with some dry skin that was removed with forceps.  She had a little bit of bleeding on the right side that was controlled with a cotton pledget soaked in adrenaline.  TMs were clear bilaterally.  Assessment: Bilateral cerumen impactions  Plan: This was cleaned in the office with improved hearing after cleaning the ear canals. She will follow-up as needed.  Radene Journey, MD

## 2021-02-25 DIAGNOSIS — Z23 Encounter for immunization: Secondary | ICD-10-CM | POA: Diagnosis not present

## 2021-05-14 ENCOUNTER — Telehealth: Payer: Self-pay | Admitting: Family Medicine

## 2021-05-14 ENCOUNTER — Other Ambulatory Visit: Payer: Self-pay

## 2021-05-14 DIAGNOSIS — I10 Essential (primary) hypertension: Secondary | ICD-10-CM

## 2021-05-14 MED ORDER — AMLODIPINE BESYLATE 2.5 MG PO TABS: 5.0000 mg | ORAL_TABLET | Freq: Every morning | ORAL | 1 refills | Status: DC

## 2021-05-14 NOTE — Telephone Encounter (Signed)
Refill sent to [facility].  Called patient to make aware.

## 2021-05-14 NOTE — Telephone Encounter (Signed)
Patient called to get refill on amLODipine (NORVASC) 2.5 MG tablet    Please send to  [facility], [place] Phone:  [phone]  Fax:  [phone]        Please advise

## 2021-05-16 ENCOUNTER — Ambulatory Visit: Payer: Medicare Other | Admitting: Cardiology

## 2021-06-05 NOTE — Progress Notes (Signed)
Cardiology Office Note   Date:  06/06/2021   ID:  [name], Dry [birth year], MRN [id number]  PCP:  Laurey Morale, MD  Cardiologist:   None Referring:  Laurey Morale, MD  Chief Complaint  Patient presents with   Transient Ischemic Attack      History of Present Illness: Linda Mays is a 81 y.o. female who is referred by Laurey Morale, MD for evaluation of TIA.  I saw her several years ago.  In 2014 she had an unremarkable echo.  She had renal Dopplers that were unremarkable.  She had a monitor that demonstrated some short runs of SVT and frequent PVCs.  She has not had any other cardiac work-up or problems.  She was in the hospital at [facility] in July 2021.  She had symptoms of right arm hip and knee numbness.  It was felt to be a TIA.  They did an extensive work-up with a negative MRI, negative DVT on venous Dopplers and no evidence of carotid stenosis.  She did have an echocardiogram that was mentioned to demonstrate a PFO with left atrial enlargement.    Since I last saw her she has had no new cardiovascular complaints.  Her daughter states she is not as physically active as she should be but with her usual activities she has no chest pain, neck or arm discomfort.  She has no shortness of breath, PND or orthopnea.  She has no palpitations, presyncope or syncope.  She has had none of her symptoms that were consistent with her previous TIA.   Past Medical History:  Diagnosis Date   Dyslipidemia    GERD (gastroesophageal reflux disease)    History of colonic polyps    Hypertension    Hypothyroidism    Osteoarthritis    Osteoporosis    TIA (transient ischemic attack) 11/06/2019    Past Surgical History:  Procedure Laterality Date   ABDOMINAL HYSTERECTOMY     Bladder tack procedure     BREAST LUMPECTOMY     OOPHORECTOMY       Current Outpatient Medications  Medication Sig Dispense Refill   amLODipine (NORVASC) 2.5 MG tablet Take 2 tablets (5 mg total) by mouth  in the morning. 90 tablet 1   aspirin EC 81 MG tablet Take 81 mg by mouth daily. Swallow whole.     metoprolol succinate (TOPROL-XL) 100 MG 24 hr tablet Take 1 tablet (100 mg total) by mouth 2 (two) times daily. Take with or immediately following a meal. 180 tablet 3   SYNTHROID 50 MCG tablet Take 1 tablet (50 mcg total) by mouth daily. 90 tablet 3   No current facility-administered medications for this visit.    Allergies:   Lisinopril-hydrochlorothiazide, Ace inhibitors, Doxycycline, Iophen c-nr [guaifenesin-codeine], Penicillins, and Sulfonamide derivatives    ROS:  Please see the history of present illness.   Otherwise, review of systems are positive for none.   All other systems are reviewed and negative.    PHYSICAL EXAM: VS:  BP 120/68    Pulse 63    Ht 5\' 2"  (1.575 m)    Wt 126 lb 9.6 oz (57.4 kg)    BMI 23.16 kg/m  , BMI Body mass index is 23.16 kg/m. GENERAL:  Well appearing NECK:  No jugular venous distention, waveform within normal limits, carotid upstroke brisk and symmetric, no bruits, no thyromegaly LUNGS:  Clear to auscultation bilaterally CHEST:  Unremarkable HEART:  PMI not displaced or sustained,S1  and S2 within normal limits, no S3, no S4, no clicks, no rubs, no murmurs ABD:  Flat, positive bowel sounds normal in frequency in pitch, no bruits, no rebound, no guarding, no midline pulsatile mass, no hepatomegaly, no splenomegaly EXT:  2 plus pulses throughout, no edema, no cyanosis no clubbing   EKG:  EKG is  ordered today. Sinus rhythm, rate 63, axis within normal limits, intervals within normal limits, no acute ST-T wave changes.   Recent Labs: No results found for requested labs within last 8760 hours.    Lipid Panel    Component Value Date/Time   CHOL 154 05/07/2020 0903   TRIG 78 05/07/2020 0903   HDL 64 05/07/2020 0903   CHOLHDL 2.4 05/07/2020 0903   CHOLHDL 4 06/15/2018 1026   VLDL 19.0 06/15/2018 1026   LDLCALC 75 05/07/2020 0903   LDLDIRECT  168.9 04/01/2012 1131      Wt Readings from Last 3 Encounters:  06/06/21 126 lb 9.6 oz (57.4 kg)  11/18/20 124 lb (56.2 kg)  09/18/20 127 lb (57.6 kg)      Other studies Reviewed: Additional studies/ records that were reviewed today include: Labs Review of the above records demonstrates: See elsewhere  ASSESSMENT AND PLAN: TIA: No evidence of atrial fib.  She is having no further symptoms.  She previously had not wanted an implanted monitor.  We never saw an indication for a DOAC.  No further work-up is suggested.   HTN:   The blood pressure is well controlled.  No change in therapy.    DYSLIPIDEMIA:    She did not tolerate Lipitor.  We will continue with diet emphasizing plant proteins.     Current medicines are reviewed at length with the patient today.  The patient does not have concerns regarding medicines.  The following changes have been made: None  Labs/ tests ordered today include:   Orders Placed This Encounter  Procedures   EKG 12-Lead     Disposition:   FU with me as needed   Signed, Minus Breeding, MD  06/06/2021 12:08 PM    [place]

## 2021-06-06 ENCOUNTER — Other Ambulatory Visit: Payer: Self-pay

## 2021-06-06 ENCOUNTER — Ambulatory Visit (INDEPENDENT_AMBULATORY_CARE_PROVIDER_SITE_OTHER): Payer: Medicare Other | Admitting: Cardiology

## 2021-06-06 ENCOUNTER — Encounter: Payer: Self-pay | Admitting: Cardiology

## 2021-06-06 VITALS — BP 120/68 | HR 63 | Ht 62.0 in | Wt 126.6 lb

## 2021-06-06 DIAGNOSIS — E785 Hyperlipidemia, unspecified: Secondary | ICD-10-CM

## 2021-06-06 DIAGNOSIS — I10 Essential (primary) hypertension: Secondary | ICD-10-CM

## 2021-06-06 DIAGNOSIS — G459 Transient cerebral ischemic attack, unspecified: Secondary | ICD-10-CM | POA: Diagnosis not present

## 2021-06-06 NOTE — Patient Instructions (Signed)
Medication Instructions:  The current medical regimen is effective;  continue present plan and medications.  *If you need a refill on your cardiac medications before your next appointment, please call your pharmacy*   Follow-Up: At [facility], you and your health needs are our priority.  As part of our continuing mission to provide you with exceptional heart care, we have created designated Provider Care Teams.  These Care Teams include your primary Cardiologist (physician) and Advanced Practice Providers (APPs -  Physician Assistants and Nurse Practitioners) who all work together to provide you with the care you need, when you need it.  We recommend signing up for the patient portal called "MyChart".  Sign up information is provided on this After Visit Summary.  MyChart is used to connect with patients for Virtual Visits (Telemedicine).  Patients are able to view lab/test results, encounter notes, upcoming appointments, etc.  Non-urgent messages can be sent to your provider as well.   To learn more about what you can do with MyChart, go to [email or web address].    Your next appointment:   As needed  The format for your next appointment:   In Person  Provider:   James Hochrein, MD      

## 2021-07-25 ENCOUNTER — Ambulatory Visit (INDEPENDENT_AMBULATORY_CARE_PROVIDER_SITE_OTHER): Payer: Medicare Other | Admitting: Family Medicine

## 2021-07-25 ENCOUNTER — Encounter: Payer: Self-pay | Admitting: Family Medicine

## 2021-07-25 VITALS — BP 137/63 | HR 72 | Temp 98.1°F | Wt 127.2 lb

## 2021-07-25 DIAGNOSIS — S90212A Contusion of left great toe with damage to nail, initial encounter: Secondary | ICD-10-CM

## 2021-07-25 DIAGNOSIS — M21611 Bunion of right foot: Secondary | ICD-10-CM | POA: Diagnosis not present

## 2021-07-25 DIAGNOSIS — M21612 Bunion of left foot: Secondary | ICD-10-CM

## 2021-07-25 DIAGNOSIS — B351 Tinea unguium: Secondary | ICD-10-CM | POA: Diagnosis not present

## 2021-07-25 NOTE — Progress Notes (Signed)
Subjective:  ? ? Patient ID: Linda Mays, female    DOB: 08-01-1940, 81 y.o.   MRN: [id number] ? ?Chief Complaint  ?Patient presents with  ? Nail Problem  ?  Nail was surgically removed years ago, should not have grown back. Hit it a few days ago and seemed lose, has used peroxide and cleaned. Does not seem so infected but states it will probably have to be removed. The big toe on left foot.  ? ? ?HPI ?Patient was seen today for acute concern.  Patient endorses bumping into her left great toenail with her R foot. Since then pt notes the L great toenail has been loose.  Denies pain, purulent drainage, fever, chills.  Patient had her left great toenail removed 10+ years ago, but it grew back.   ? ? ?Patient lives in [place]. ? ?Past Medical History:  ?Diagnosis Date  ? Dyslipidemia   ? GERD (gastroesophageal reflux disease)   ? History of colonic polyps   ? Hypertension   ? Hypothyroidism   ? Osteoarthritis   ? Osteoporosis   ? TIA (transient ischemic attack) 11/06/2019  ? ? ?Allergies  ?Allergen Reactions  ? Lisinopril-Hydrochlorothiazide Other (See Comments)  ?  Hot flashes  ? Ace Inhibitors   ?  angioedema  ? Doxycycline   ? Iophen C-Nr [Guaifenesin-Codeine]   ? Penicillins   ? Sulfonamide Derivatives   ? ? ?ROS ?General: Denies fever, chills, night sweats, changes in weight, changes in appetite ?HEENT: Denies headaches, ear pain, changes in vision, rhinorrhea, sore throat ?CV: Denies CP, palpitations, SOB, orthopnea ?Pulm: Denies SOB, cough, wheezing ?GI: Denies abdominal pain, nausea, vomiting, diarrhea, constipation ?GU: Denies dysuria, hematuria, frequency, vaginal discharge ?Msk: Denies muscle cramps, joint pains ?Neuro: Denies weakness, numbness, tingling ?Skin: Denies rashes, bruising + loose left great toenail ?Psych: Denies depression, anxiety, hallucinations ? ?   ?Objective:  ?  ?Blood pressure 137/63, pulse 72, temperature 98.1 ?F (36.7 ?C), temperature source Oral, weight 127 lb 3.2 oz (57.7 kg), SpO2  99 %. ? ?Gen. Pleasant, well-nourished, in no distress, normal affect   ?HEENT: [place]/AT, face symmetric, left eye with mild erythema, conjunctiva clear, no scleral icterus, PERRLA, EOMI, nares patent without drainage ?Lungs: no accessory muscle use ?Cardiovascular: RRR, no peripheral edema ?Neuro:  A&Ox3, CN II-XII intact, normal gait ?Skin:  Warm, dry, intact.  B/l bunions.  L great toenail onychomycotic with dried blood underneath, no drainage or tenderness.  Mild erythema or skin without increased warmth. ? ? ?Wt Readings from Last 3 Encounters:  ?07/25/21 127 lb 3.2 oz (57.7 kg)  ?06/06/21 126 lb 9.6 oz (57.4 kg)  ?11/18/20 124 lb (56.2 kg)  ? ? ?Lab Results  ?Component Value Date  ? WBC 5.2 06/15/2018  ? HGB 15.1 (H) 06/15/2018  ? HCT 43.3 06/15/2018  ? PLT 180.0 06/15/2018  ? GLUCOSE 91 06/15/2018  ? CHOL 154 05/07/2020  ? TRIG 78 05/07/2020  ? HDL 64 05/07/2020  ? LDLDIRECT 168.9 04/01/2012  ? Curtisha Bendix 75 05/07/2020  ? ALT 99 (H) 05/07/2020  ? AST 48 (H) 05/07/2020  ? NA 139 06/15/2018  ? K 5.0 06/15/2018  ? CL 103 06/15/2018  ? CREATININE 0.70 06/15/2018  ? BUN 14 06/15/2018  ? CO2 28 06/15/2018  ? TSH 4.03 06/15/2018  ? INR 1.0 06/15/2018  ? HGBA1C 5.1 06/15/2018  ? ? ?Assessment/Plan: ? ?Subungual hematoma of great toe of left foot, initial encounter ? - Plan: Ambulatory referral to Podiatry ? ?Onychomycosis  ?-  Plan: Ambulatory referral to Podiatry ? ?Bilateral bunions  ?- Plan: Ambulatory referral to Podiatry ? ?Left great toenail loose 2/2 traumatic subungual hematoma caused when pt hit toenail against other foot or unknown object.  Will place referral to podiatry for toenail removal.  Advised to keep area clean and dry.   Currently does not appear to be infected.  Discussed s/s of infection.  Given strict precautions. ? ?F/u prn ? ?Grier Mitts, MD ?

## 2021-08-11 ENCOUNTER — Telehealth: Payer: Self-pay | Admitting: Family Medicine

## 2021-08-11 ENCOUNTER — Other Ambulatory Visit: Payer: Self-pay

## 2021-08-11 DIAGNOSIS — I10 Essential (primary) hypertension: Secondary | ICD-10-CM

## 2021-08-11 MED ORDER — AMLODIPINE BESYLATE 2.5 MG PO TABS: 5.0000 mg | ORAL_TABLET | Freq: Every morning | ORAL | 1 refills | Status: DC

## 2021-08-11 NOTE — Telephone Encounter (Signed)
Pt calling for refill of amLODipine (NORVASC) 2.5 MG tablet  preferred pharmacy is  ?  ?[place], [place] Phone:  [phone]  ?Fax:  [phone]  ?  ? ?

## 2021-08-11 NOTE — Telephone Encounter (Signed)
Called patient aware refill for Amlodipine sent to [facility] ?

## 2021-08-12 ENCOUNTER — Telehealth: Payer: Self-pay

## 2021-08-12 ENCOUNTER — Ambulatory Visit: Payer: Medicare Other

## 2021-08-12 ENCOUNTER — Ambulatory Visit (INDEPENDENT_AMBULATORY_CARE_PROVIDER_SITE_OTHER): Payer: Medicare Other | Admitting: Podiatry

## 2021-08-12 ENCOUNTER — Encounter: Payer: Self-pay | Admitting: Podiatry

## 2021-08-12 DIAGNOSIS — L603 Nail dystrophy: Secondary | ICD-10-CM | POA: Diagnosis not present

## 2021-08-12 NOTE — Telephone Encounter (Signed)
This nurse attempted to call patient three times for scheduled telephonic AWV. Message left that we will call again to reschedule. ?

## 2021-08-12 NOTE — Progress Notes (Signed)
?  Subjective:  ?Patient ID: Linda Mays, female    DOB: 04/20/1941,   MRN: [id number] ? ?Chief Complaint  ?Patient presents with  ? Nail Problem  ?   ?Onychomycosis, Subungual hematoma of great toe of left foot  ? ?  ?81 y.o. female presents for concern of left great toe nail. Relates several years ago the nail was removed and it was not supposed to grow back. Relates lately it has been bleeding and feels loose. Relates a history of nail fungus in there diagnosed by PCP. Here today with daughter.  . Denies any other pedal complaints. Denies n/v/f/c.  ? ?Past Medical History:  ?Diagnosis Date  ? Dyslipidemia   ? GERD (gastroesophageal reflux disease)   ? History of colonic polyps   ? Hypertension   ? Hypothyroidism   ? Osteoarthritis   ? Osteoporosis   ? TIA (transient ischemic attack) 11/06/2019  ? ? ?Objective:  ?Physical Exam: ?Vascular: DP/PT pulses 2/4 bilateral. CFT <3 seconds. Normal hair growth on digits. No edema.  ?Skin. No lacerations or abrasions bilateral feet. Left hallux nail loose and elongated appears to only be attached and growing from medial border upon debridement healthy underlying nail bed.  ?Musculoskeletal: MMT 5/5 bilateral lower extremities in DF, PF, Inversion and Eversion. Deceased ROM in DF of ankle joint.  ?Neurological: Sensation intact to light touch.  ? ?Assessment:  ? ?1. Onychodystrophy   ? ? ? ?Plan:  ?Patient was evaluated and treated and all questions answered. ?-Examined patient ?-Discussed treatment options for painful dystrophic nails  ?-Left hallux nail was debrided back to base without incident.  ?-Discussed use of urea nail gel to soften the thickened nails.   ?-Patient to return as needed.  ? ? ?Lorenda Peck, DPM  ? ? ?

## 2021-08-26 ENCOUNTER — Ambulatory Visit (INDEPENDENT_AMBULATORY_CARE_PROVIDER_SITE_OTHER): Payer: Medicare Other

## 2021-08-26 VITALS — Ht 62.0 in | Wt 127.0 lb

## 2021-08-26 DIAGNOSIS — Z0000 Encounter for general adult medical examination without abnormal findings: Secondary | ICD-10-CM | POA: Diagnosis not present

## 2021-08-26 NOTE — Progress Notes (Signed)
? ?Subjective:  ? Linda Mays is a 81 y.o. female who presents for Medicare Annual (Subsequent) preventive examination. ? ?Review of Systems    ?Virtual Visit via Telephone Note ? ?I connected with  Linda Mays on 08/26/21 at  9:45 AM EDT by telephone and verified that I am speaking with the correct person using two identifiers. ? ?Location: ?Patient: Home ?Provider: Office ?Persons participating in the virtual visit: patient/Nurse Health Advisor ?  ?I discussed the limitations, risks, security and privacy concerns of performing an evaluation and management service by telephone and the availability of in person appointments. The patient expressed understanding and agreed to proceed. ? ?Interactive audio and video telecommunications were attempted between this nurse and patient, however failed, due to patient having technical difficulties OR patient did not have access to video capability.  We continued and completed visit with audio only. ? ?Some vital signs may be absent or patient reported.  ? ?Linda Peaches, LPN  ?Cardiac Risk Factors include: advanced age (>41mn, >>62women);hypertension ? ?   ?Objective:  ?  ?Today's Vitals  ? 08/26/21 0949  ?Weight: 127 lb (57.6 kg)  ?Height: '5\' 2"'$  (1.575 m)  ? ?Body mass index is 23.23 kg/m?. ? ? ?  08/26/2021  ?  9:58 AM 08/06/2020  ? 10:26 AM  ?Advanced Directives  ?Does Patient Have a Medical Advance Directive? Yes No  ?Type of AParamedicof [place]age of the [place] will   ?Does patient want to make changes to medical advance directive? No - Patient declined   ?Copy of [place] Chart? No - copy requested   ?Would patient like information on creating a medical advance directive?  No - Patient declined  ? ? ?Current Medications (verified) ?Outpatient Encounter Medications as of 08/26/2021  ?Medication Sig  ? amLODipine (NORVASC) 2.5 MG tablet Take 2 tablets (5 mg total) by mouth in the morning.  ? aspirin EC 81 MG tablet Take 81 mg by  mouth daily. Swallow whole.  ? metoprolol succinate (TOPROL-XL) 100 MG 24 hr tablet Take 1 tablet (100 mg total) by mouth 2 (two) times daily. Take with or immediately following a meal.  ? SYNTHROID 50 MCG tablet Take 1 tablet (50 mcg total) by mouth daily.  ? ?No facility-administered encounter medications on file as of 08/26/2021.  ? ? ?Allergies (verified) ?Lisinopril-hydrochlorothiazide, Ace inhibitors, Doxycycline, Iophen c-nr [guaifenesin-codeine], Penicillins, and Sulfonamide derivatives  ? ?History: ?Past Medical History:  ?Diagnosis Date  ? Dyslipidemia   ? GERD (gastroesophageal reflux disease)   ? History of colonic polyps   ? Hypertension   ? Hypothyroidism   ? Osteoarthritis   ? Osteoporosis   ? TIA (transient ischemic attack) 11/06/2019  ? ?Past Surgical History:  ?Procedure Laterality Date  ? ABDOMINAL HYSTERECTOMY    ? Bladder tack procedure    ? BREAST LUMPECTOMY    ? OOPHORECTOMY    ? ?Family History  ?Problem Relation Age of Onset  ? Colon cancer Other   ?     1st degree relative<60  ? Diabetes Other   ?     1st degree relative  ? Hypertension Other   ? Prostate cancer Other   ?     1st degree relative <50  ? Diverticulitis Mother   ? CAD Father 876 ? ?Social History  ? ?Socioeconomic History  ? Marital status: Divorced  ?  Spouse name: Not on file  ? Number of children: 2  ? Years of  education: Not on file  ? Highest education level: Not on file  ?Occupational History  ? Occupation: Retired  ?  Employer: RETIRED  ?Tobacco Use  ? Smoking status: Never  ? Smokeless tobacco: Never  ?Substance and Sexual Activity  ? Alcohol use: No  ?  Alcohol/week: 0.0 standard drinks  ? Drug use: No  ? Sexual activity: Not on file  ?Other Topics Concern  ? Not on file  ?Social History Narrative  ? Lives at home by herself  ? ?Social Determinants of Health  ? ?Financial Resource Strain: Low Risk   ? Difficulty of Paying Living Expenses: Not hard at all  ?Food Insecurity: No Food Insecurity  ? Worried About Ship broker in the Last Year: Never true  ? Ran Out of Food in the Last Year: Never true  ?Transportation Needs: No Transportation Needs  ? Lack of Transportation (Medical): No  ? Lack of Transportation (Non-Medical): No  ?Physical Activity: Inactive  ? Days of Exercise per Week: 0 days  ? Minutes of Exercise per Session: 0 min  ?Stress: No Stress Concern Present  ? Feeling of Stress : Not at all  ?Social Connections: Moderately Integrated  ? Frequency of Communication with Friends and Family: More than three times a week  ? Frequency of Social Gatherings with Friends and Family: More than three times a week  ? Attends Religious Services: More than 4 times per year  ? Active Member of Clubs or Organizations: Yes  ? Attends Archivist Meetings: More than 4 times per year  ? Marital Status: Divorced  ? ? ? ?Clinical Intake: ? ?Pre-visit preparation completed: No ?Diabetic?  No ? ? ?Activities of Daily Living ? ?  08/26/2021  ?  9:57 AM  ?In your present state of health, do you have any difficulty performing the following activities:  ?Hearing? 0  ?Vision? 0  ?Difficulty concentrating or making decisions? 0  ?Walking or climbing stairs? 0  ?Dressing or bathing? 0  ?Doing errands, shopping? 0  ?Preparing Food and eating ? N  ?Using the Toilet? N  ?In the past six months, have you accidently leaked urine? N  ?Do you have problems with loss of bowel control? N  ?Managing your Medications? N  ?Managing your Finances? N  ?Housekeeping or managing your Housekeeping? N  ? ? ?Patient Care Team: ?Laurey Morale, MD as PCP - General ? ?Indicate any recent Medical Services you may have received from other than [facility] providers in the past year (date may be approximate). ? ?   ?Assessment:  ? This is a routine wellness examination for Linda Mays. ? ?Hearing/Vision screen ?Hearing Screening - Comments:: No Hearing difficulty ?Vision Screening - Comments:: Wears glasses. Followed by [facility] ? ?Dietary issues and exercise  activities discussed: ?Exercise limited by: None identified ? ? Goals Addressed   ? ?  ?  ?  ?  ?  ? This Visit's Progress  ?   Patient Stated (pt-stated)     ?   None at this time. ?  ? ?  ? ?Depression Screen ? ?  08/26/2021  ?  9:54 AM 08/06/2020  ? 10:25 AM 05/30/2015  ? 11:22 AM 04/04/2014  ?  9:44 AM  ?PHQ 2/9 Scores  ?PHQ - 2 Score 0 0 0 0  ?  ?Fall Risk ? ?  08/26/2021  ?  9:58 AM 08/06/2020  ? 10:28 AM 11/17/2019  ?  2:59 PM 11/25/2018  ?  1:21 PM 05/30/2015  ? 11:22 AM  ?Fall Risk   ?Falls in the past year? 0 0 0  No  ?Comment    Emmi Telephone Survey: data to providers prior to load   ?Number falls in past yr: 0 0 0    ?Comment    Emmi Telephone Survey Actual Response =    ?Injury with Fall? 0 0 0    ?Risk for fall due to : No Fall Risks Impaired vision;Impaired balance/gait     ?Risk for fall due to: Comment  not as good as it use to be in balance     ?Follow up  Falls prevention discussed     ? ? ?FALL RISK PREVENTION PERTAINING TO THE HOME: ? ?Any stairs in or around the home? No  ?If so, are there any without handrails? No  ?Home free of loose throw rugs in walkways, pet beds, electrical cords, etc? Yes ?Adequate lighting in your home to reduce risk of falls? Yes  ? ?ASSISTIVE DEVICES UTILIZED TO PREVENT FALLS: ? ?Life alert? No  ?Use of a cane, walker or w/c? No  ?Grab bars in the bathroom? Yes  ?Shower chair or bench in shower? No  ?Elevated toilet seat or a handicapped toilet? No  ? ?TIMED UP AND GO: ? ?Was the test performed? No . Audio Visit ? ?Cognitive Function: ?  ?  ? ?  08/26/2021  ?  9:59 AM 08/06/2020  ? 10:31 AM  ?6CIT Screen  ?What Year? 0 points 0 points  ?What month? 0 points 0 points  ?What time? 0 points   ?Count back from 20 0 points 0 points  ?Months in reverse 0 points 0 points  ?Repeat phrase 0 points 6 points  ?Total Score 0 points   ? ? ?Immunizations ?Immunization History  ?Administered Date(s) Administered  ? Influenza Split 03/26/2011, 03/01/2012  ? Influenza Whole 02/07/2008, 01/30/2009,  01/22/2010  ? Influenza, High Dose Seasonal PF 04/01/2020  ? Influenza,inj,Quad PF,6+ Mos 02/14/2013, 02/20/2014, 01/30/2015, 02/11/2016, 03/01/2017, 02/16/2018, 02/27/2019  ? Moderna Sars-Covid-2 [employer]

## 2021-08-26 NOTE — Patient Instructions (Addendum)
?Linda Mays , ?Thank you for taking time to come for your Medicare Wellness Visit. I appreciate your ongoing commitment to your health goals. Please review the following plan we discussed and let me know if I can assist you in the future.  ? ?These are the goals we discussed: ? Goals   ? ?   Patient Stated (pt-stated)   ?   None at this time. ?  ? ?  ?  ?This is a list of the screening recommended for you and due dates:  ?Health Maintenance  ?Topic Date Due  ? COVID-19 Vaccine (4 - Booster for Moderna series) 09/11/2021*  ? Zoster (Shingles) Vaccine (1 of 2) 11/26/2021*  ? Pneumonia Vaccine (1 - PCV) 08/27/2022*  ? DEXA scan (bone density measurement)  08/27/2022*  ? Tetanus Vaccine  08/27/2022*  ? Flu Shot  11/25/2021  ? HPV Vaccine  Aged Out  ?*Topic was postponed. The date shown is not the original due date.  ? ?Advanced directives: Yes ? ?Conditions/risks identified: None ? ?Next appointment: Follow up in one year for your annual wellness visit  ? ? ?Preventive Care 51 Years and Older, Female ?Preventive care refers to lifestyle choices and visits with your health care provider that can promote health and wellness. ?What does preventive care include? ?A yearly physical exam. This is also called an annual well check. ?Dental exams once or twice a year. ?Routine eye exams. Ask your health care provider how often you should have your eyes checked. ?Personal lifestyle choices, including: ?Daily care of your teeth and gums. ?Regular physical activity. ?Eating a healthy diet. ?Avoiding tobacco and drug use. ?Limiting alcohol use. ?Practicing safe sex. ?Taking low-dose aspirin every day. ?Taking vitamin and mineral supplements as recommended by your health care provider. ?What happens during an annual well check? ?The services and screenings done by your health care provider during your annual well check will depend on your age, overall health, lifestyle risk factors, and family history of disease. ?Counseling  ?Your  health care provider may ask you questions about your: ?Alcohol use. ?Tobacco use. ?Drug use. ?Emotional well-being. ?Home and relationship well-being. ?Sexual activity. ?Eating habits. ?History of falls. ?Memory and ability to understand (cognition). ?Work and work Statistician. ?Reproductive health. ?Screening  ?You may have the following tests or measurements: ?Height, weight, and BMI. ?Blood pressure. ?Lipid and cholesterol levels. These may be checked every 5 years, or more frequently if you are over 59 years old. ?Skin check. ?Lung cancer screening. You may have this screening every year starting at age 51 if you have a 30-pack-year history of smoking and currently smoke or have quit within the past 15 years. ?Fecal occult blood test (FOBT) of the stool. You may have this test every year starting at age 42. ?Flexible sigmoidoscopy or colonoscopy. You may have a sigmoidoscopy every 5 years or a colonoscopy every 10 years starting at age 64. ?Hepatitis C blood test. ?Hepatitis B blood test. ?Sexually transmitted disease (STD) testing. ?Diabetes screening. This is done by checking your blood sugar (glucose) after you have not eaten for a while (fasting). You may have this done every 1-3 years. ?Bone density scan. This is done to screen for osteoporosis. You may have this done starting at age 10. ?Mammogram. This may be done every 1-2 years. Talk to your health care provider about how often you should have regular mammograms. ?Talk with your health care provider about your test results, treatment options, and if necessary, the need for more tests. ?  Vaccines  ?Your health care provider may recommend certain vaccines, such as: ?Influenza vaccine. This is recommended every year. ?Tetanus, diphtheria, and acellular pertussis (Tdap, Td) vaccine. You may need a Td booster every 10 years. ?Zoster vaccine. You may need this after age 59. ?Pneumococcal 13-valent conjugate (PCV13) vaccine. One dose is recommended after age  68. ?Pneumococcal polysaccharide (PPSV23) vaccine. One dose is recommended after age 21. ?Talk to your health care provider about which screenings and vaccines you need and how often you need them. ?This information is not intended to replace advice given to you by your health care provider. Make sure you discuss any questions you have with your health care provider. ?Document Released: 05/10/2015 Document Revised: 01/01/2016 Document Reviewed: 02/12/2015 ?Elsevier Interactive Patient Education ? 2017 [place]. ? ?Fall Prevention in the Home ?Falls can cause injuries. They can happen to people of all ages. There are many things you can do to make your home safe and to help prevent falls. ?What can I do on the outside of my home? ?Regularly fix the edges of walkways and driveways and fix any cracks. ?Remove anything that might make you trip as you walk through a door, such as a raised step or threshold. ?Trim any bushes or trees on the path to your home. ?Use bright outdoor lighting. ?Clear any walking paths of anything that might make someone trip, such as rocks or tools. ?Regularly check to see if handrails are loose or broken. Make sure that both sides of any steps have handrails. ?Any raised decks and porches should have guardrails on the edges. ?Have any leaves, snow, or ice cleared regularly. ?Use sand or salt on walking paths during winter. ?Clean up any spills in your garage right away. This includes oil or grease spills. ?What can I do in the bathroom? ?Use night lights. ?Install grab bars by the toilet and in the tub and shower. Do not use towel bars as grab bars. ?Use non-skid mats or decals in the tub or shower. ?If you need to sit down in the shower, use a plastic, non-slip stool. ?Keep the floor dry. Clean up any water that spills on the floor as soon as it happens. ?Remove soap buildup in the tub or shower regularly. ?Attach bath mats securely with double-sided non-slip rug tape. ?Do not have throw  rugs and other things on the floor that can make you trip. ?What can I do in the bedroom? ?Use night lights. ?Make sure that you have a light by your bed that is easy to reach. ?Do not use any sheets or blankets that are too big for your bed. They should not hang down onto the floor. ?Have a firm chair that has side arms. You can use this for support while you get dressed. ?Do not have throw rugs and other things on the floor that can make you trip. ?What can I do in the kitchen? ?Clean up any spills right away. ?Avoid walking on wet floors. ?Keep items that you use a lot in easy-to-reach places. ?If you need to reach something above you, use a strong step stool that has a grab bar. ?Keep electrical cords out of the way. ?Do not use floor polish or wax that makes floors slippery. If you must use wax, use non-skid floor wax. ?Do not have throw rugs and other things on the floor that can make you trip. ?What can I do with my stairs? ?Do not leave any items on the stairs. ?Make sure that  there are handrails on both sides of the stairs and use them. Fix handrails that are broken or loose. Make sure that handrails are as long as the stairways. ?Check any carpeting to make sure that it is firmly attached to the stairs. Fix any carpet that is loose or worn. ?Avoid having throw rugs at the top or bottom of the stairs. If you do have throw rugs, attach them to the floor with carpet tape. ?Make sure that you have a light switch at the top of the stairs and the bottom of the stairs. If you do not have them, ask someone to add them for you. ?What else can I do to help prevent falls? ?Wear shoes that: ?Do not have high heels. ?Have rubber bottoms. ?Are comfortable and fit you well. ?Are closed at the toe. Do not wear sandals. ?If you use a stepladder: ?Make sure that it is fully opened. Do not climb a closed stepladder. ?Make sure that both sides of the stepladder are locked into place. ?Ask someone to hold it for you, if  possible. ?Clearly mark and make sure that you can see: ?Any grab bars or handrails. ?First and last steps. ?Where the edge of each step is. ?Use tools that help you move around (mobility aids) if they are n

## 2021-09-08 ENCOUNTER — Other Ambulatory Visit: Payer: Self-pay | Admitting: Family Medicine

## 2021-10-07 ENCOUNTER — Other Ambulatory Visit: Payer: Self-pay | Admitting: Family Medicine

## 2021-10-09 ENCOUNTER — Telehealth: Payer: Self-pay | Admitting: Family Medicine

## 2021-10-09 ENCOUNTER — Other Ambulatory Visit: Payer: Self-pay

## 2021-10-09 DIAGNOSIS — I10 Essential (primary) hypertension: Secondary | ICD-10-CM

## 2021-10-09 MED ORDER — AMLODIPINE BESYLATE 2.5 MG PO TABS: 5.0000 mg | ORAL_TABLET | Freq: Every morning | ORAL | 1 refills | Status: DC

## 2021-10-09 NOTE — Telephone Encounter (Addendum)
Refill sent.  Lvm for patient to call office to scheduled f/u visit.

## 2021-10-09 NOTE — Telephone Encounter (Signed)
Pt requesting refill of metoprolol succinate (TOPROL-XL) 100 MG 24 hr tablet and is requesting more than a 30 days supply  [place], [place] Phone:  [phone]  Fax:  [phone]

## 2021-10-10 ENCOUNTER — Other Ambulatory Visit: Payer: Self-pay | Admitting: Family Medicine

## 2021-10-30 ENCOUNTER — Encounter: Payer: Self-pay | Admitting: Family Medicine

## 2021-10-30 ENCOUNTER — Ambulatory Visit (INDEPENDENT_AMBULATORY_CARE_PROVIDER_SITE_OTHER): Payer: Medicare Other | Admitting: Family Medicine

## 2021-10-30 VITALS — BP 110/68 | HR 68 | Temp 98.1°F | Ht 62.0 in | Wt 125.4 lb

## 2021-10-30 DIAGNOSIS — E039 Hypothyroidism, unspecified: Secondary | ICD-10-CM

## 2021-10-30 DIAGNOSIS — G8929 Other chronic pain: Secondary | ICD-10-CM

## 2021-10-30 DIAGNOSIS — E785 Hyperlipidemia, unspecified: Secondary | ICD-10-CM

## 2021-10-30 DIAGNOSIS — M545 Low back pain, unspecified: Secondary | ICD-10-CM

## 2021-10-30 DIAGNOSIS — M810 Age-related osteoporosis without current pathological fracture: Secondary | ICD-10-CM | POA: Diagnosis not present

## 2021-10-30 DIAGNOSIS — R739 Hyperglycemia, unspecified: Secondary | ICD-10-CM | POA: Diagnosis not present

## 2021-10-30 DIAGNOSIS — N390 Urinary tract infection, site not specified: Secondary | ICD-10-CM | POA: Diagnosis not present

## 2021-10-30 DIAGNOSIS — I10 Essential (primary) hypertension: Secondary | ICD-10-CM

## 2021-10-30 DIAGNOSIS — K219 Gastro-esophageal reflux disease without esophagitis: Secondary | ICD-10-CM | POA: Diagnosis not present

## 2021-10-30 DIAGNOSIS — D869 Sarcoidosis, unspecified: Secondary | ICD-10-CM

## 2021-10-30 LAB — CBC WITH DIFFERENTIAL/PLATELET
Basophils Absolute: 0 10*3/uL (ref 0.0–0.1)
Basophils Relative: 0.2 % (ref 0.0–3.0)
Eosinophils Absolute: 0.2 10*3/uL (ref 0.0–0.7)
Eosinophils Relative: 4.2 % (ref 0.0–5.0)
HCT: 42 % (ref 36.0–46.0)
Hemoglobin: 14.2 g/dL (ref 12.0–15.0)
Lymphocytes Relative: 35.1 % (ref 12.0–46.0)
Lymphs Abs: 1.9 10*3/uL (ref 0.7–4.0)
MCHC: 33.9 g/dL (ref 30.0–36.0)
MCV: 91.9 fl (ref 78.0–100.0)
Monocytes Absolute: 0.5 10*3/uL (ref 0.1–1.0)
Monocytes Relative: 9.5 % (ref 3.0–12.0)
Neutro Abs: 2.7 10*3/uL (ref 1.4–7.7)
Neutrophils Relative %: 51 % (ref 43.0–77.0)
Platelets: 181 10*3/uL (ref 150.0–400.0)
RBC: 4.57 Mil/uL (ref 3.87–5.11)
RDW: 12.4 % (ref 11.5–15.5)
WBC: 5.4 10*3/uL (ref 4.0–10.5)

## 2021-10-30 LAB — HEPATIC FUNCTION PANEL
ALT: 71 U/L — ABNORMAL HIGH (ref 0–35)
AST: 41 U/L — ABNORMAL HIGH (ref 0–37)
Albumin: 4.2 g/dL (ref 3.5–5.2)
Alkaline Phosphatase: 99 U/L (ref 39–117)
Bilirubin, Direct: 0.2 mg/dL (ref 0.0–0.3)
Total Bilirubin: 0.8 mg/dL (ref 0.2–1.2)
Total Protein: 6.8 g/dL (ref 6.0–8.3)

## 2021-10-30 LAB — POC URINALSYSI DIPSTICK (AUTOMATED)
Bilirubin, UA: NEGATIVE
Blood, UA: NEGATIVE
Glucose, UA: NEGATIVE
Ketones, UA: NEGATIVE
Nitrite, UA: POSITIVE
Protein, UA: NEGATIVE
Spec Grav, UA: 1.01 (ref 1.010–1.025)
Urobilinogen, UA: 0.2 E.U./dL
pH, UA: 6 (ref 5.0–8.0)

## 2021-10-30 LAB — LIPID PANEL
Cholesterol: 168 mg/dL (ref 0–200)
HDL: 52.6 mg/dL (ref >39.00)
LDL Cholesterol: 97 mg/dL (ref 0–99)
NonHDL: 115.46
Total CHOL/HDL Ratio: 3
Triglycerides: 90 mg/dL (ref 0.0–149.0)
VLDL: 18 mg/dL (ref 0.0–40.0)

## 2021-10-30 LAB — BASIC METABOLIC PANEL
BUN: 18 mg/dL (ref 6–23)
CO2: 27 mEq/L (ref 19–32)
Calcium: 9.7 mg/dL (ref 8.4–10.5)
Chloride: 103 mEq/L (ref 96–112)
Creatinine, Ser: 0.75 mg/dL (ref 0.40–1.20)
GFR: 73.86 mL/min (ref >60.00)
Glucose, Bld: 91 mg/dL (ref 70–99)
Potassium: 4.5 mEq/L (ref 3.5–5.1)
Sodium: 137 mEq/L (ref 135–145)

## 2021-10-30 LAB — HEMOGLOBIN A1C: Hgb A1c MFr Bld: 5.4 % (ref 4.6–6.5)

## 2021-10-30 LAB — T3, FREE: T3, Free: 2.9 pg/mL (ref 2.3–4.2)

## 2021-10-30 LAB — T4, FREE: Free T4: 1.15 ng/dL (ref 0.60–1.60)

## 2021-10-30 LAB — TSH: TSH: 4.82 u[IU]/mL (ref 0.35–5.50)

## 2021-10-30 MED ORDER — AMLODIPINE BESYLATE 5 MG PO TABS: 5.0000 mg | ORAL_TABLET | Freq: Every day | ORAL | 3 refills | Status: DC

## 2021-10-30 MED ORDER — SYNTHROID 50 MCG PO TABS
50.0000 ug | ORAL_TABLET | Freq: Every day | ORAL | 3 refills | Status: DC
Start: 2021-10-30 — End: 2022-09-09

## 2021-10-30 MED ORDER — METOPROLOL SUCCINATE ER 100 MG PO TB24: ORAL_TABLET | ORAL | 3 refills | Status: DC

## 2021-10-30 MED ORDER — CIPROFLOXACIN HCL 500 MG PO TABS: 500.0000 mg | ORAL_TABLET | Freq: Two times a day (BID) | ORAL | 0 refills | Status: DC

## 2021-10-30 NOTE — Progress Notes (Signed)
Subjective:    Patient ID: Linda Mays, female    DOB: 10-06-1940, 81 y.o.   MRN: [id number]  HPI Here to follow up on issues and to discuss some urinary symptoms that started one week ago. She has increased frequency and urgency as well as some mild pelvic pain. No fever. Otherwise she has been doing fairly well. Her BP has been stable at home. Her GERD is controlled with diet. She still has intermittent back pain that she manages with heat and AsperCream.    Review of Systems  Constitutional: Negative.   HENT: Negative.    Eyes: Negative.   Respiratory: Negative.    Cardiovascular: Negative.   Gastrointestinal: Negative.   Genitourinary:  Positive for frequency and urgency. Negative for decreased urine volume, difficulty urinating, dyspareunia, dysuria, enuresis, flank pain, hematuria and pelvic pain.  Musculoskeletal:  Positive for back pain.  Skin: Negative.   Neurological: Negative.  Negative for headaches.  Psychiatric/Behavioral: Negative.         Objective:   Physical Exam Constitutional:      General: She is not in acute distress.    Appearance: Normal appearance. She is well-developed.  HENT:     Head: Normocephalic and atraumatic.     Right Ear: External ear normal.     Left Ear: External ear normal.     Nose: Nose normal.     Mouth/Throat:     Pharynx: No oropharyngeal exudate.  Eyes:     General: No scleral icterus.    Conjunctiva/sclera: Conjunctivae normal.     Pupils: Pupils are equal, round, and reactive to light.  Neck:     Thyroid: No thyromegaly.     Vascular: No JVD.  Cardiovascular:     Rate and Rhythm: Normal rate and regular rhythm.     Heart sounds: Normal heart sounds. No murmur heard.    No friction rub. No gallop.  Pulmonary:     Effort: Pulmonary effort is normal. No respiratory distress.     Breath sounds: Normal breath sounds. No wheezing or rales.  Chest:     Chest wall: No tenderness.  Abdominal:     General: Bowel sounds are  normal. There is no distension.     Palpations: Abdomen is soft. There is no mass.     Tenderness: There is no abdominal tenderness. There is no guarding or rebound.  Musculoskeletal:        General: No tenderness. Normal range of motion.     Cervical back: Normal range of motion and neck supple.  Lymphadenopathy:     Cervical: No cervical adenopathy.  Skin:    General: Skin is warm and dry.     Findings: No erythema or rash.  Neurological:     Mental Status: She is alert and oriented to person, place, and time.     Cranial Nerves: No cranial nerve deficit.     Motor: No abnormal muscle tone.     Coordination: Coordination normal.     Deep Tendon Reflexes: Reflexes are normal and symmetric. Reflexes normal.  Psychiatric:        Behavior: Behavior normal.        Thought Content: Thought content normal.        Judgment: Judgment normal.           Assessment & Plan:  She has a UTI and we will treat this with 7 days of Cipro. We will culture the sample. Her HTN is well controlled. We will  get fasting labs today to include a full thyroid panel. He back pain is stable. We spent a total of ( 31  ) minutes reviewing records and discussing these issues.  Alysia Penna, MD

## 2021-10-30 NOTE — Addendum Note (Signed)
Addended by: Wyvonne Lenz on: 10/30/2021 11:23 AM   Modules accepted: Orders

## 2021-11-01 LAB — URINE CULTURE
MICRO NUMBER:: 13612094
SPECIMEN QUALITY:: ADEQUATE

## 2021-11-21 ENCOUNTER — Telehealth: Payer: Self-pay | Admitting: Family Medicine

## 2021-11-21 NOTE — Telephone Encounter (Signed)
Spoke with pt advised that Dr Lucia Gaskins retired in 2022, pt advised to call their office and request to transfer care with one of the ENT in the office, verbalized understanding

## 2021-11-21 NOTE — Telephone Encounter (Signed)
Pt called and stated   Re:  ENT Referral  Pt wanted to know if MD / CMA has any updated contact info, as the number she was given was not in service and she could not find any more info on that MD.   Pt said tell MD she is in [place].   [phone]

## 2021-12-09 DIAGNOSIS — H93293 Other abnormal auditory perceptions, bilateral: Secondary | ICD-10-CM | POA: Diagnosis not present

## 2021-12-09 DIAGNOSIS — H6123 Impacted cerumen, bilateral: Secondary | ICD-10-CM | POA: Diagnosis not present

## 2022-02-17 DIAGNOSIS — Z23 Encounter for immunization: Secondary | ICD-10-CM | POA: Diagnosis not present

## 2022-03-03 ENCOUNTER — Ambulatory Visit (INDEPENDENT_AMBULATORY_CARE_PROVIDER_SITE_OTHER): Payer: Medicare Other | Admitting: Family Medicine

## 2022-03-03 ENCOUNTER — Encounter: Payer: Self-pay | Admitting: Family Medicine

## 2022-03-03 VITALS — BP 118/76 | HR 75 | Temp 98.4°F | Wt 125.0 lb

## 2022-03-03 DIAGNOSIS — R300 Dysuria: Secondary | ICD-10-CM | POA: Diagnosis not present

## 2022-03-03 DIAGNOSIS — N390 Urinary tract infection, site not specified: Secondary | ICD-10-CM | POA: Diagnosis not present

## 2022-03-03 LAB — POC URINALSYSI DIPSTICK (AUTOMATED)
Bilirubin, UA: NEGATIVE
Glucose, UA: NEGATIVE
Ketones, UA: NEGATIVE
Protein, UA: POSITIVE — AB
Spec Grav, UA: 1.015 (ref 1.010–1.025)
Urobilinogen, UA: 0.2 E.U./dL
pH, UA: 5.5 (ref 5.0–8.0)

## 2022-03-03 MED ORDER — CIPROFLOXACIN HCL 500 MG PO TABS: 500.0000 mg | ORAL_TABLET | Freq: Two times a day (BID) | ORAL | 0 refills | Status: DC

## 2022-03-03 NOTE — Progress Notes (Signed)
   Subjective:    Patient ID: Linda Mays, female    DOB: May 20, 1940, 81 y.o.   MRN: [id number]  HPI Here for 5 days of urinary urgency and burning. No fever.    Review of Systems  Constitutional: Negative.   Respiratory: Negative.    Cardiovascular: Negative.   Gastrointestinal: Negative.   Genitourinary:  Positive for dysuria, frequency and urgency. Negative for flank pain and hematuria.       Objective:   Physical Exam Constitutional:      Appearance: Normal appearance. She is not ill-appearing.  Cardiovascular:     Rate and Rhythm: Normal rate and regular rhythm.     Pulses: Normal pulses.     Heart sounds: Normal heart sounds.  Pulmonary:     Effort: Pulmonary effort is normal.     Breath sounds: Normal breath sounds.  Abdominal:     Tenderness: There is no right CVA tenderness or left CVA tenderness.  Neurological:     Mental Status: She is alert.           Assessment & Plan:  UTI, treat with 7 days of Cipro. Culture the sample. She will drink plenty of water.  Alysia Penna, MD

## 2022-03-06 LAB — URINE CULTURE
MICRO NUMBER:: 14155156
SPECIMEN QUALITY:: ADEQUATE

## 2022-08-28 ENCOUNTER — Ambulatory Visit (INDEPENDENT_AMBULATORY_CARE_PROVIDER_SITE_OTHER): Payer: Medicare Other

## 2022-08-28 VITALS — Ht 60.0 in | Wt 120.0 lb

## 2022-08-28 DIAGNOSIS — Z0000 Encounter for general adult medical examination without abnormal findings: Secondary | ICD-10-CM

## 2022-08-28 NOTE — Patient Instructions (Signed)
Linda Mays , Thank you for taking time to come for your Medicare Wellness Visit. I appreciate your ongoing commitment to your health goals. Please review the following plan we discussed and let me know if I can assist you in the future.   These are the goals we discussed:  Goals       Patient Stated (pt-stated)      None at this time.      Patient Stated      08/28/2022, no goals        This is a list of the screening recommended for you and due dates:  Health Maintenance  Topic Date Due   Zoster (Shingles) Vaccine (1 of 2) Never done   Pneumonia Vaccine (1 of 1 - PCV) Never done   DEXA scan (bone density measurement)  Never done   COVID-19 Vaccine (5 - 2023-24 season) 12/26/2021   Flu Shot  11/26/2022   Medicare Annual Wellness Visit  08/28/2023   DTaP/Tdap/Td vaccine (2 - Td or Tdap) 09/04/2030   HPV Vaccine  Aged Out    Advanced directives: Advance directive discussed with you today.   Conditions/risks identified: none  Next appointment: Follow up in one year for your annual wellness visit    Preventive Care 65 Years and Older, Female Preventive care refers to lifestyle choices and visits with your health care provider that can promote health and wellness. What does preventive care include? A yearly physical exam. This is also called an annual well check. Dental exams once or twice a year. Routine eye exams. Ask your health care provider how often you should have your eyes checked. Personal lifestyle choices, including: Daily care of your teeth and gums. Regular physical activity. Eating a healthy diet. Avoiding tobacco and drug use. Limiting alcohol use. Practicing safe sex. Taking low-dose aspirin every day. Taking vitamin and mineral supplements as recommended by your health care provider. What happens during an annual well check? The services and screenings done by your health care provider during your annual well check will depend on your age, overall health,  lifestyle risk factors, and family history of disease. Counseling  Your health care provider may ask you questions about your: Alcohol use. Tobacco use. Drug use. Emotional well-being. Home and relationship well-being. Sexual activity. Eating habits. History of falls. Memory and ability to understand (cognition). Work and work Astronomer. Reproductive health. Screening  You may have the following tests or measurements: Height, weight, and BMI. Blood pressure. Lipid and cholesterol levels. These may be checked every 5 years, or more frequently if you are over 86 years old. Skin check. Lung cancer screening. You may have this screening every year starting at age 66 if you have a 30-pack-year history of smoking and currently smoke or have quit within the past 15 years. Fecal occult blood test (FOBT) of the stool. You may have this test every year starting at age 44. Flexible sigmoidoscopy or colonoscopy. You may have a sigmoidoscopy every 5 years or a colonoscopy every 10 years starting at age 7. Hepatitis C blood test. Hepatitis B blood test. Sexually transmitted disease (STD) testing. Diabetes screening. This is done by checking your blood sugar (glucose) after you have not eaten for a while (fasting). You may have this done every 1-3 years. Bone density scan. This is done to screen for osteoporosis. You may have this done starting at age 32. Mammogram. This may be done every 1-2 years. Talk to your health care provider about how often you  should have regular mammograms. Talk with your health care provider about your test results, treatment options, and if necessary, the need for more tests. Vaccines  Your health care provider may recommend certain vaccines, such as: Influenza vaccine. This is recommended every year. Tetanus, diphtheria, and acellular pertussis (Tdap, Td) vaccine. You may need a Td booster every 10 years. Zoster vaccine. You may need this after age  55. Pneumococcal 13-valent conjugate (PCV13) vaccine. One dose is recommended after age 50. Pneumococcal polysaccharide (PPSV23) vaccine. One dose is recommended after age 21. Talk to your health care provider about which screenings and vaccines you need and how often you need them. This information is not intended to replace advice given to you by your health care provider. Make sure you discuss any questions you have with your health care provider. Document Released: 05/10/2015 Document Revised: 01/01/2016 Document Reviewed: 02/12/2015 Elsevier Interactive Patient Education  2017 [place] Prevention in the Home Falls can cause injuries. They can happen to people of all ages. There are many things you can do to make your home safe and to help prevent falls. What can I do on the outside of my home? Regularly fix the edges of walkways and driveways and fix any cracks. Remove anything that might make you trip as you walk through a door, such as a raised step or threshold. Trim any bushes or trees on the path to your home. Use bright outdoor lighting. Clear any walking paths of anything that might make someone trip, such as rocks or tools. Regularly check to see if handrails are loose or broken. Make sure that both sides of any steps have handrails. Any raised decks and porches should have guardrails on the edges. Have any leaves, snow, or ice cleared regularly. Use sand or salt on walking paths during winter. Clean up any spills in your garage right away. This includes oil or grease spills. What can I do in the bathroom? Use night lights. Install grab bars by the toilet and in the tub and shower. Do not use towel bars as grab bars. Use non-skid mats or decals in the tub or shower. If you need to sit down in the shower, use a plastic, non-slip stool. Keep the floor dry. Clean up any water that spills on the floor as soon as it happens. Remove soap buildup in the tub or shower  regularly. Attach bath mats securely with double-sided non-slip rug tape. Do not have throw rugs and other things on the floor that can make you trip. What can I do in the bedroom? Use night lights. Make sure that you have a light by your bed that is easy to reach. Do not use any sheets or blankets that are too big for your bed. They should not hang down onto the floor. Have a firm chair that has side arms. You can use this for support while you get dressed. Do not have throw rugs and other things on the floor that can make you trip. What can I do in the kitchen? Clean up any spills right away. Avoid walking on wet floors. Keep items that you use a lot in easy-to-reach places. If you need to reach something above you, use a strong step stool that has a grab bar. Keep electrical cords out of the way. Do not use floor polish or wax that makes floors slippery. If you must use wax, use non-skid floor wax. Do not have throw rugs and other things on the  floor that can make you trip. What can I do with my stairs? Do not leave any items on the stairs. Make sure that there are handrails on both sides of the stairs and use them. Fix handrails that are broken or loose. Make sure that handrails are as long as the stairways. Check any carpeting to make sure that it is firmly attached to the stairs. Fix any carpet that is loose or worn. Avoid having throw rugs at the top or bottom of the stairs. If you do have throw rugs, attach them to the floor with carpet tape. Make sure that you have a light switch at the top of the stairs and the bottom of the stairs. If you do not have them, ask someone to add them for you. What else can I do to help prevent falls? Wear shoes that: Do not have high heels. Have rubber bottoms. Are comfortable and fit you well. Are closed at the toe. Do not wear sandals. If you use a stepladder: Make sure that it is fully opened. Do not climb a closed stepladder. Make sure that  both sides of the stepladder are locked into place. Ask someone to hold it for you, if possible. Clearly mark and make sure that you can see: Any grab bars or handrails. First and last steps. Where the edge of each step is. Use tools that help you move around (mobility aids) if they are needed. These include: Canes. Walkers. Scooters. Crutches. Turn on the lights when you go into a dark area. Replace any light bulbs as soon as they burn out. Set up your furniture so you have a clear path. Avoid moving your furniture around. If any of your floors are uneven, fix them. If there are any pets around you, be aware of where they are. Review your medicines with your doctor. Some medicines can make you feel dizzy. This can increase your chance of falling. Ask your doctor what other things that you can do to help prevent falls. This information is not intended to replace advice given to you by your health care provider. Make sure you discuss any questions you have with your health care provider. Document Released: 02/07/2009 Document Revised: 09/19/2015 Document Reviewed: 05/18/2014 Elsevier Interactive Patient Education  2017 [employer].

## 2022-08-28 NOTE — Progress Notes (Signed)
I connected with  Linda Mays on 08/28/22 by a audio enabled telemedicine application and verified that I am speaking with the correct person using two identifiers.  Patient Location: Home  Provider Location: Office/Clinic  I discussed the limitations of evaluation and management by telemedicine. The patient expressed understanding and agreed to proceed.  Subjective:   Linda Mays is a 82 y.o. female who presents for Medicare Annual (Subsequent) preventive examination.  Review of Systems     Cardiac Risk Factors include: advanced age (>13men, >57 women);dyslipidemia;hypertension     Objective:    Today's Vitals   08/28/22 0912  Weight: 120 lb (54.4 kg)  Height: 5' (1.524 m)   Body mass index is 23.44 kg/m.     08/28/2022    9:15 AM 08/26/2021    9:58 AM 08/06/2020   10:26 AM  Advanced Directives  Does Patient Have a Medical Advance Directive? No Yes No  Type of Special educational needs teacher of [place];Living will   Does patient want to make changes to medical advance directive?  No - Patient declined   Copy of Healthcare Power of Attorney in Chart?  No - copy requested   Would patient like information on creating a medical advance directive?   No - Patient declined    Current Medications (verified) Outpatient Encounter Medications as of 08/28/2022  Medication Sig   amLODipine (NORVASC) 5 MG tablet Take 1 tablet (5 mg total) by mouth daily.   aspirin EC 81 MG tablet Take 81 mg by mouth daily. Swallow whole.   metoprolol succinate (TOPROL-XL) 100 MG 24 hr tablet TAKE 1 TABLET BY MOUTH TWICE DAILY . APPOINTMENT REQUIRED FOR FUTURE REFILLS   SYNTHROID 50 MCG tablet Take 1 tablet (50 mcg total) by mouth daily before breakfast.   ciprofloxacin (CIPRO) 500 MG tablet Take 1 tablet (500 mg total) by mouth 2 (two) times daily. (Patient not taking: Reported on 08/28/2022)   No facility-administered encounter medications on file as of 08/28/2022.    Allergies  (verified) Lisinopril-hydrochlorothiazide, Ace inhibitors, Doxycycline, Iophen c-nr [guaifenesin-codeine], Penicillins, and Sulfonamide derivatives   History: Past Medical History:  Diagnosis Date   Dyslipidemia    GERD (gastroesophageal reflux disease)    History of colonic polyps    Hypertension    Hypothyroidism    Osteoarthritis    Osteoporosis    TIA (transient ischemic attack) 11/06/2019   Past Surgical History:  Procedure Laterality Date   ABDOMINAL HYSTERECTOMY     Bladder tack procedure     BREAST LUMPECTOMY     OOPHORECTOMY     Family History  Problem Relation Age of Onset   Colon cancer Other        1st degree relative<60   Diabetes Other        1st degree relative   Hypertension Other    Prostate cancer Other        1st degree relative <50   Diverticulitis Mother    CAD Father 69   Social History   Socioeconomic History   Marital status: Divorced    Spouse name: Not on file   Number of children: 2   Years of education: Not on file   Highest education level: Not on file  Occupational History   Occupation: Retired    Associate Professor: RETIRED  Tobacco Use   Smoking status: Never   Smokeless tobacco: Never  Vaping Use   Vaping Use: Never used  Substance and Sexual Activity   Alcohol use: No  Alcohol/week: 0.0 standard drinks of alcohol   Drug use: No   Sexual activity: Not on file  Other Topics Concern   Not on file  Social History Narrative   Lives at home by herself   Social Determinants of Health   Financial Resource Strain: Low Risk  (08/28/2022)   Overall Financial Resource Strain (CARDIA)    Difficulty of Paying Living Expenses: Not hard at all  Food Insecurity: No Food Insecurity (08/28/2022)   Hunger Vital Sign    Worried About Running Out of Food in the Last Year: Never true    Ran Out of Food in the Last Year: Never true  Transportation Needs: No Transportation Needs (08/28/2022)   PRAPARE - Administrator, Civil Service  (Medical): No    Lack of Transportation (Non-Medical): No  Physical Activity: Inactive (08/28/2022)   Exercise Vital Sign    Days of Exercise per Week: 0 days    Minutes of Exercise per Session: 0 min  Stress: No Stress Concern Present (08/28/2022)   [employer] of Occupational Health - Occupational Stress Questionnaire    Feeling of Stress : Not at all  Social Connections: Moderately Integrated (08/26/2021)   Social Connection and Isolation Panel [NHANES]    Frequency of Communication with Friends and Family: More than three times a week    Frequency of Social Gatherings with Friends and Family: More than three times a week    Attends Religious Services: More than 4 times per year    Active Member of [employer] or Organizations: Yes    Attends Engineer, structural: More than 4 times per year    Marital Status: Divorced    Tobacco Counseling Counseling given: Not Answered   Clinical Intake:  Pre-visit preparation completed: Yes  Pain : No/denies pain     Nutritional Status: BMI of 19-24  Normal Nutritional Risks: None Diabetes: No  How often do you need to have someone help you when you read instructions, pamphlets, or other written materials from your doctor or pharmacy?: 1 - Never  Diabetic? no  Interpreter Needed?: No  Information entered by :: NAllen LPN   Activities of Daily Living    08/28/2022    9:16 AM  In your present state of health, do you have any difficulty performing the following activities:  Hearing? 1  Comment no hearing aids  Vision? 0  Difficulty concentrating or making decisions? 0  Walking or climbing stairs? 0  Dressing or bathing? 0  Doing errands, shopping? 0  Preparing Food and eating ? N  Using the Toilet? N  In the past six months, have you accidently leaked urine? N  Do you have problems with loss of bowel control? N  Managing your Medications? N  Managing your Finances? N  Housekeeping or managing your Housekeeping? N     Patient Care Team: Nelwyn Salisbury, MD as PCP - General  Indicate any recent Medical Services you may have received from other than [facility] providers in the past year (date may be approximate).     Assessment:   This is a routine wellness examination for [name].  Hearing/Vision screen Vision Screening - Comments:: No regular eye exams, Dr. Alice Reichert  Dietary issues and exercise activities discussed: Current Exercise Habits: The patient does not participate in regular exercise at present   Goals Addressed             This Visit's Progress    Patient Stated  08/28/2022, no goals       Depression Screen    08/28/2022    9:16 AM 03/03/2022    3:25 PM 10/30/2021   10:28 AM 08/26/2021    9:54 AM 08/06/2020   10:25 AM 05/30/2015   11:22 AM 04/04/2014    9:44 AM  PHQ 2/9 Scores  PHQ - 2 Score 0 0 0 0 0 0 0  PHQ- 9 Score  0 0        Fall Risk    08/28/2022    9:16 AM 03/03/2022    3:26 PM 10/30/2021   10:28 AM 08/26/2021    9:58 AM 08/06/2020   10:28 AM  Fall Risk   Falls in the past year? 0 0 0 0 0  Number falls in past yr: 0 0 0 0 0  Injury with Fall? 0 0 0 0 0  Risk for fall due to : Medication side effect No Fall Risks No Fall Risks No Fall Risks Impaired vision;Impaired balance/gait  Risk for fall due to: Comment     not as good as it use to be in balance  Follow up Falls prevention discussed;Education provided;Falls evaluation completed Falls evaluation completed Falls evaluation completed  Falls prevention discussed    FALL RISK PREVENTION PERTAINING TO THE HOME:  Any stairs in or around the home? No  If so, are there any without handrails?  N/a Home free of loose throw rugs in walkways, pet beds, electrical cords, etc? yes  Adequate lighting in your home to reduce risk of falls? Yes   ASSISTIVE DEVICES UTILIZED TO PREVENT FALLS:  Life alert? No  Use of a cane, walker or w/c? No  Grab bars in the bathroom? No  Shower chair or bench in shower? No  Elevated  toilet seat or a handicapped toilet? No   TIMED UP AND GO:  Was the test performed? No .      Cognitive Function:        08/28/2022    9:17 AM 08/26/2021    9:59 AM 08/06/2020   10:31 AM  6CIT Screen  What Year? 0 points 0 points 0 points  What month? 0 points 0 points 0 points  What time? 0 points 0 points   Count back from 20 0 points 0 points 0 points  Months in reverse 4 points 0 points 0 points  Repeat phrase 0 points 0 points 6 points  Total Score 4 points 0 points     Immunizations Immunization History  Administered Date(s) Administered   Influenza Split 03/26/2011, 03/01/2012   Influenza Whole 02/07/2008, 01/30/2009, 01/22/2010   Influenza, High Dose Seasonal PF 04/01/2020   Influenza,inj,Quad PF,6+ Mos 02/14/2013, 02/20/2014, 01/30/2015, 02/11/2016, 03/01/2017, 02/16/2018, 02/27/2019   Influenza-Unspecified 02/20/2022   Moderna Covid-19 Vaccine Bivalent Booster 42yrs & up 02/14/2021   Moderna Sars-Covid-2 Vaccination 05/22/2019, 06/16/2019, 02/23/2020   Tdap 09/03/2020    TDAP status: Up to date  Flu Vaccine status: Up to date  Pneumococcal vaccine status: Due, Education has been provided regarding the importance of this vaccine. Advised may receive this vaccine at local pharmacy or Health Dept. Aware to provide a copy of the vaccination record if obtained from local pharmacy or Health Dept. Verbalized acceptance and understanding.  Covid-19 vaccine status: Completed vaccines  Qualifies for Shingles Vaccine? Yes   Zostavax completed No   Shingrix Completed?: No.    Education has been provided regarding the importance of this vaccine. Patient has been advised to call insurance company  to determine out of pocket expense if they have not yet received this vaccine. Advised may also receive vaccine at local pharmacy or Health Dept. Verbalized acceptance and understanding.  Screening Tests Health Maintenance  Topic Date Due   Zoster Vaccines- Shingrix (1 of 2)  Never done   Pneumonia Vaccine 83+ Years old (1 of 1 - PCV) Never done   DEXA SCAN  Never done   COVID-19 Vaccine (5 - 2023-24 season) 12/26/2021   Medicare Annual Wellness (AWV)  08/27/2022   INFLUENZA VACCINE  11/26/2022   DTaP/Tdap/Td (2 - Td or Tdap) 09/04/2030   HPV VACCINES  Aged Out    Health Maintenance  Health Maintenance Due  Topic Date Due   Zoster Vaccines- Shingrix (1 of 2) Never done   Pneumonia Vaccine 42+ Years old (1 of 1 - PCV) Never done   DEXA SCAN  Never done   COVID-19 Vaccine (5 - 2023-24 season) 12/26/2021   Medicare Annual Wellness (AWV)  08/27/2022    Colorectal cancer screening: No longer required.   Mammogram status: No longer required due to age.  Bone Density status: due  Lung Cancer Screening: (Low Dose CT Chest recommended if Age 43-80 years, 30 pack-year currently smoking OR have quit w/in 15years.) does not qualify.   Lung Cancer Screening Referral: no  Additional Screening:  Hepatitis C Screening: does not qualify;   Vision Screening: Recommended annual ophthalmology exams for early detection of glaucoma and other disorders of the eye. Is the patient up to date with their annual eye exam?  No  Who is the provider or what is the name of the office in which the patient attends annual eye exams? Dr. Alice Reichert If pt is not established with a provider, would they like to be referred to a provider to establish care? No .   Dental Screening: Recommended annual dental exams for proper oral hygiene  Community Resource Referral / Chronic Care Management: CRR required this visit?  No   CCM required this visit?  No      Plan:     I have personally reviewed and noted the following in the patient's chart:   Medical and social history Use of alcohol, tobacco or illicit drugs  Current medications and supplements including opioid prescriptions. Patient is not currently taking opioid prescriptions. Functional ability and status Nutritional  status Physical activity Advanced directives List of other physicians Hospitalizations, surgeries, and ER visits in previous 12 months Vitals Screenings to include cognitive, depression, and falls Referrals and appointments  In addition, I have reviewed and discussed with patient certain preventive protocols, quality metrics, and best practice recommendations. A written personalized care plan for preventive services as well as general preventive health recommendations were provided to patient.     Barb Merino, LPN   05/02/1094   Nurse Notes: none  Due to this being a virtual visit, the after visit summary with patients personalized plan was offered to patient via mail or my-chart.  to pick up at office at next visit

## 2022-09-09 ENCOUNTER — Other Ambulatory Visit: Payer: Self-pay | Admitting: Family Medicine

## 2022-09-09 MED ORDER — METOPROLOL SUCCINATE ER 100 MG PO TB24: ORAL_TABLET | ORAL | 0 refills | Status: DC

## 2022-09-09 NOTE — Telephone Encounter (Signed)
metoprolol succinate (TOPROL-XL) 100 MG 24 hr tablet Medication

## 2022-09-09 NOTE — Addendum Note (Signed)
Addended by: Christy Sartorius on: 09/09/2022 10:19 AM   Modules accepted: Orders

## 2022-11-02 ENCOUNTER — Telehealth: Payer: Self-pay | Admitting: Family Medicine

## 2022-11-02 MED ORDER — AMLODIPINE BESYLATE 5 MG PO TABS: 5.0000 mg | ORAL_TABLET | Freq: Every day | ORAL | 0 refills | Status: DC

## 2022-11-02 MED ORDER — SYNTHROID 50 MCG PO TABS: 50.0000 ug | ORAL_TABLET | Freq: Every day | ORAL | 0 refills | Status: DC

## 2022-11-02 NOTE — Telephone Encounter (Signed)
Prescription Request  11/02/2022  LOV: 03/03/2022  What is the name of the medication or equipment?  amLODipine (NORVASC) 5 MG tablet  SYNTHROID 50 MCG tablet    Have you contacted your pharmacy to request a refill? Yes   Which pharmacy would you like this sent to?  [facility] [place], [place] - [address] [facility] [place] [address] Phone: [phone] Fax: [phone]    Patient notified that their request is being sent to the clinical staff for review and that they should receive a response within 2 business days.   Please advise at Mobile: [phone]

## 2022-11-27 ENCOUNTER — Telehealth: Payer: Self-pay | Admitting: Family Medicine

## 2022-11-27 NOTE — Telephone Encounter (Signed)
Prescription Request  11/27/2022  LOV: 03/03/2022  What is the name of the medication or equipment? metoprolol succinate (TOPROL-XL) 100 MG 24 hr tablet   Have you contacted your pharmacy to request a refill? No   Which pharmacy would you like this sent to?  [facility], [place] - [address] [facility] [place] [address] Phone: [phone] Fax: [phone]    Patient notified that their request is being sent to the clinical staff for review and that they should receive a response within 2 business days.   Please advise at

## 2022-11-30 ENCOUNTER — Other Ambulatory Visit: Payer: Self-pay | Admitting: Family Medicine

## 2022-12-01 NOTE — Telephone Encounter (Signed)
Refill has been sent.  °

## 2023-02-17 DIAGNOSIS — Z23 Encounter for immunization: Secondary | ICD-10-CM | POA: Diagnosis not present

## 2023-03-02 ENCOUNTER — Other Ambulatory Visit: Payer: Self-pay | Admitting: Family Medicine

## 2023-03-10 ENCOUNTER — Ambulatory Visit (INDEPENDENT_AMBULATORY_CARE_PROVIDER_SITE_OTHER): Payer: Medicare Other | Admitting: Family Medicine

## 2023-03-10 ENCOUNTER — Encounter: Payer: Self-pay | Admitting: Family Medicine

## 2023-03-10 VITALS — BP 118/78 | HR 66 | Temp 98.4°F | Wt 124.8 lb

## 2023-03-10 DIAGNOSIS — K5900 Constipation, unspecified: Secondary | ICD-10-CM | POA: Insufficient documentation

## 2023-03-10 DIAGNOSIS — R739 Hyperglycemia, unspecified: Secondary | ICD-10-CM

## 2023-03-10 DIAGNOSIS — M545 Low back pain, unspecified: Secondary | ICD-10-CM | POA: Diagnosis not present

## 2023-03-10 DIAGNOSIS — G8929 Other chronic pain: Secondary | ICD-10-CM | POA: Diagnosis not present

## 2023-03-10 DIAGNOSIS — E039 Hypothyroidism, unspecified: Secondary | ICD-10-CM | POA: Diagnosis not present

## 2023-03-10 DIAGNOSIS — M810 Age-related osteoporosis without current pathological fracture: Secondary | ICD-10-CM

## 2023-03-10 DIAGNOSIS — K219 Gastro-esophageal reflux disease without esophagitis: Secondary | ICD-10-CM | POA: Diagnosis not present

## 2023-03-10 DIAGNOSIS — E785 Hyperlipidemia, unspecified: Secondary | ICD-10-CM

## 2023-03-10 DIAGNOSIS — I10 Essential (primary) hypertension: Secondary | ICD-10-CM | POA: Diagnosis not present

## 2023-03-10 MED ORDER — AMLODIPINE BESYLATE 5 MG PO TABS: 5.0000 mg | ORAL_TABLET | Freq: Every day | ORAL | 3 refills | Status: DC

## 2023-03-10 MED ORDER — SYNTHROID 50 MCG PO TABS: 50.0000 ug | ORAL_TABLET | Freq: Every day | ORAL | 3 refills | Status: DC

## 2023-03-10 MED ORDER — METOPROLOL SUCCINATE ER 100 MG PO TB24: 100.0000 mg | ORAL_TABLET | Freq: Two times a day (BID) | ORAL | 3 refills | Status: DC

## 2023-03-10 NOTE — Progress Notes (Signed)
Subjective:    Patient ID: Linda Mays, female    DOB: 08-Apr-1941, 82 y.o.   MRN: [id number]  HPI Here to follow up on issues. She has been having some constipation, and she asks what is safe for her to use. Also she has been taking ASA 81 mg daily for years, and she asks if this is still needed. Her BP has been stable. She gets yearly mammograms.   Review of Systems  Constitutional: Negative.   HENT: Negative.    Eyes: Negative.   Respiratory: Negative.    Cardiovascular: Negative.   Gastrointestinal:  Positive for constipation.  Genitourinary:  Negative for decreased urine volume, difficulty urinating, dyspareunia, dysuria, enuresis, flank pain, frequency, hematuria, pelvic pain and urgency.  Musculoskeletal: Negative.   Skin: Negative.   Neurological: Negative.  Negative for headaches.  Psychiatric/Behavioral: Negative.         Objective:   Physical Exam Constitutional:      General: She is not in acute distress.    Appearance: Normal appearance. She is well-developed.  HENT:     Head: Normocephalic and atraumatic.     Right Ear: External ear normal.     Left Ear: External ear normal.     Nose: Nose normal.     Mouth/Throat:     Pharynx: No oropharyngeal exudate.  Eyes:     General: No scleral icterus.    Conjunctiva/sclera: Conjunctivae normal.     Pupils: Pupils are equal, round, and reactive to light.  Neck:     Thyroid: No thyromegaly.     Vascular: No JVD.  Cardiovascular:     Rate and Rhythm: Normal rate and regular rhythm.     Pulses: Normal pulses.     Heart sounds: Normal heart sounds. No murmur heard.    No friction rub. No gallop.  Pulmonary:     Effort: Pulmonary effort is normal. No respiratory distress.     Breath sounds: Normal breath sounds. No wheezing or rales.  Chest:     Chest wall: No tenderness.  Abdominal:     General: Bowel sounds are normal. There is no distension.     Palpations: Abdomen is soft. There is no mass.     Tenderness:  There is no abdominal tenderness. There is no guarding or rebound.  Musculoskeletal:        General: No tenderness. Normal range of motion.     Cervical back: Normal range of motion and neck supple.  Lymphadenopathy:     Cervical: No cervical adenopathy.  Skin:    General: Skin is warm and dry.     Findings: No erythema or rash.  Neurological:     General: No focal deficit present.     Mental Status: She is alert and oriented to person, place, and time.     Cranial Nerves: No cranial nerve deficit.     Motor: No abnormal muscle tone.     Coordination: Coordination normal.     Deep Tendon Reflexes: Reflexes are normal and symmetric. Reflexes normal.  Psychiatric:        Mood and Affect: Mood normal.        Behavior: Behavior normal.        Thought Content: Thought content normal.        Judgment: Judgment normal.           Assessment & Plan:  Her HTN is well controlled. For the constipation, she will try Miralax daily. Her GERD is stable. We  will get fasting labs to check lipids, thyroid levels, etc. As for the ASA, we agreed that she can stop taking this. We spent a total of (35   ) minutes reviewing records and discussing these issues.  Gershon Crane, MD

## 2023-03-17 ENCOUNTER — Other Ambulatory Visit: Payer: Medicare Other

## 2023-03-22 ENCOUNTER — Other Ambulatory Visit (INDEPENDENT_AMBULATORY_CARE_PROVIDER_SITE_OTHER): Payer: Medicare Other

## 2023-03-22 DIAGNOSIS — E039 Hypothyroidism, unspecified: Secondary | ICD-10-CM | POA: Diagnosis not present

## 2023-03-22 DIAGNOSIS — R739 Hyperglycemia, unspecified: Secondary | ICD-10-CM | POA: Diagnosis not present

## 2023-03-22 DIAGNOSIS — E785 Hyperlipidemia, unspecified: Secondary | ICD-10-CM | POA: Diagnosis not present

## 2023-03-22 LAB — CBC WITH DIFFERENTIAL/PLATELET
Basophils Absolute: 0 10*3/uL (ref 0.0–0.1)
Basophils Relative: 0.2 % (ref 0.0–3.0)
Eosinophils Absolute: 0.2 10*3/uL (ref 0.0–0.7)
Eosinophils Relative: 3.1 % (ref 0.0–5.0)
HCT: 42.9 % (ref 36.0–46.0)
Hemoglobin: 14.5 g/dL (ref 12.0–15.0)
Lymphocytes Relative: 38.6 % (ref 12.0–46.0)
Lymphs Abs: 1.9 10*3/uL (ref 0.7–4.0)
MCHC: 33.8 g/dL (ref 30.0–36.0)
MCV: 93.3 fL (ref 78.0–100.0)
Monocytes Absolute: 0.5 10*3/uL (ref 0.1–1.0)
Monocytes Relative: 11 % (ref 3.0–12.0)
Neutro Abs: 2.3 10*3/uL (ref 1.4–7.7)
Neutrophils Relative %: 47.1 % (ref 43.0–77.0)
Platelets: 179 10*3/uL (ref 150.0–400.0)
RBC: 4.6 Mil/uL (ref 3.87–5.11)
RDW: 11.9 % (ref 11.5–15.5)
WBC: 4.9 10*3/uL (ref 4.0–10.5)

## 2023-03-22 LAB — LIPID PANEL
Cholesterol: 178 mg/dL (ref 0–200)
HDL: 49.6 mg/dL (ref >39.00)
LDL Cholesterol: 112 mg/dL — ABNORMAL HIGH (ref 0–99)
NonHDL: 127.94
Total CHOL/HDL Ratio: 4
Triglycerides: 82 mg/dL (ref 0.0–149.0)
VLDL: 16.4 mg/dL (ref 0.0–40.0)

## 2023-03-22 LAB — HEPATIC FUNCTION PANEL
ALT: 62 U/L — ABNORMAL HIGH (ref 0–35)
AST: 36 U/L (ref 0–37)
Albumin: 4.1 g/dL (ref 3.5–5.2)
Alkaline Phosphatase: 86 U/L (ref 39–117)
Bilirubin, Direct: 0.1 mg/dL (ref 0.0–0.3)
Total Bilirubin: 0.7 mg/dL (ref 0.2–1.2)
Total Protein: 6.5 g/dL (ref 6.0–8.3)

## 2023-03-22 LAB — T4, FREE: Free T4: 1.02 ng/dL (ref 0.60–1.60)

## 2023-03-22 LAB — HEMOGLOBIN A1C: Hgb A1c MFr Bld: 5.4 % (ref 4.6–6.5)

## 2023-03-22 LAB — BASIC METABOLIC PANEL
BUN: 17 mg/dL (ref 6–23)
CO2: 26 meq/L (ref 19–32)
Calcium: 9.5 mg/dL (ref 8.4–10.5)
Chloride: 105 meq/L (ref 96–112)
Creatinine, Ser: 0.77 mg/dL (ref 0.40–1.20)
GFR: 70.87 mL/min (ref >60.00)
Glucose, Bld: 95 mg/dL (ref 70–99)
Potassium: 4.4 meq/L (ref 3.5–5.1)
Sodium: 139 meq/L (ref 135–145)

## 2023-03-22 LAB — TSH: TSH: 5.36 u[IU]/mL (ref 0.35–5.50)

## 2023-03-22 LAB — T3, FREE: T3, Free: 3.4 pg/mL (ref 2.3–4.2)

## 2023-05-12 ENCOUNTER — Ambulatory Visit: Payer: Self-pay | Admitting: Family Medicine

## 2023-05-12 NOTE — Telephone Encounter (Signed)
 Called pt cancelled app with Dr [place] for 05/14/23 and pt scheduled with Dr Alyne Babinski tomorrow 05/13/23

## 2023-05-12 NOTE — Telephone Encounter (Signed)
.   Chief Complaint: UTI symptoms Symptoms: Burning, cloudy urine, frequency, pressure Frequency: one - two weeks Pertinent Negatives: Patient denies Flank pain, fever, pain Disposition: [] ED /[] Urgent Care (no appt availability in office) / [x] Appointment(In office/virtual)/ []  [facility]/ [] Home Care/ [] Refused Recommended Disposition /[] [place] [facility]/ []  Follow-up with PCP Additional Notes: Patient complaining of urinary frequency, burning when urinating, cloudy urine, and pressure. Pt denies fever, flank pain. Pt advised by this RN to call back if symptoms worsen or fever, flank pain develop. Pt scheduled appt on 05/14/23.  Copied from CRM [id number]. Topic: Clinical - Red Word Triage >> May 12, 2023  9:19 AM Aline Ireland wrote: Red Word that prompted transfer to Nurse Triage: Pt thinks she may have a UTI itching, burning, and frquent urination and has been going on for about a week now. Reason for Disposition  All other urine symptoms  Answer Assessment - Initial Assessment Questions 1. SYMPTOM: "What's the main symptom you're concerned about?" (e.g., frequency, incontinence)     Burning, pressure, frequency, cloudy. 2. ONSET: "When did the   start?"     One - two weeks. 3. PAIN: "Is there any pain?" If Yes, ask: "How bad is it?" (Scale: 1-10; mild, moderate, severe)     Denies, just burning 4. CAUSE: "What do you think is causing the symptoms?"     Denies 5. OTHER SYMPTOMS: "Do you have any other symptoms?" (e.g., blood in urine, fever, flank pain, pain with urination)     Denies  Protocols used: Urinary Symptoms-A-AH

## 2023-05-13 ENCOUNTER — Ambulatory Visit: Payer: Medicare Other | Admitting: Family Medicine

## 2023-05-14 ENCOUNTER — Ambulatory Visit (INDEPENDENT_AMBULATORY_CARE_PROVIDER_SITE_OTHER): Payer: Medicare Other | Admitting: Family Medicine

## 2023-05-14 ENCOUNTER — Ambulatory Visit: Payer: Medicare Other | Admitting: Family Medicine

## 2023-05-14 ENCOUNTER — Encounter: Payer: Self-pay | Admitting: Family Medicine

## 2023-05-14 VITALS — BP 134/80 | HR 71 | Temp 98.8°F | Ht 60.0 in | Wt 125.2 lb

## 2023-05-14 DIAGNOSIS — R3989 Other symptoms and signs involving the genitourinary system: Secondary | ICD-10-CM

## 2023-05-14 DIAGNOSIS — N3001 Acute cystitis with hematuria: Secondary | ICD-10-CM

## 2023-05-14 DIAGNOSIS — K5900 Constipation, unspecified: Secondary | ICD-10-CM | POA: Diagnosis not present

## 2023-05-14 LAB — POC URINALSYSI DIPSTICK (AUTOMATED)
Bilirubin, UA: NEGATIVE
Glucose, UA: NEGATIVE
Ketones, UA: NEGATIVE
Nitrite, UA: POSITIVE
Protein, UA: POSITIVE — AB
Spec Grav, UA: 1.025
Urobilinogen, UA: 0.2 U/dL
pH, UA: 5.5

## 2023-05-14 MED ORDER — CIPROFLOXACIN HCL 500 MG PO TABS: 500.0000 mg | ORAL_TABLET | Freq: Two times a day (BID) | ORAL | 0 refills | Status: AC

## 2023-05-14 NOTE — Progress Notes (Signed)
Established Patient Office Visit   Subjective  Patient ID: Linda Mays, female    DOB: [birth year]  Age: 83 y.o. MRN: [id number]  Chief Complaint  Patient presents with   Urinary Tract Infection    Started 2 weeks ago, dark color, frequent urination, odor     Patient is an 83 year old female followed by Dr. Clent Ridges and seen for acute concern.  Patient endorses urinary frequency, dysuria, suprapubic pressure, decreased output, and dark urine color x 2 weeks.  Patient denies fever, chills, nausea, vomiting, back pain. Not drinking much water daily.  Pt endorses h/o constipation.  Previously advised to take MiraLAX but inquires if it is okay to take.  Patient with allergies to penicillin and sulfa drugs.  Thinks penicillins caused hives.    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   Osteoporosis 12/06/2006   History of colonic polyps 12/06/2006   Past Medical History:  Diagnosis Date   Dyslipidemia    GERD (gastroesophageal reflux disease)    History of colonic polyps    Hypertension    Hypothyroidism    Osteoarthritis    Osteoporosis    TIA (transient ischemic attack) 11/06/2019   Past Surgical History:  Procedure Laterality Date   ABDOMINAL HYSTERECTOMY     Bladder tack procedure     BREAST LUMPECTOMY     OOPHORECTOMY     Social History   Tobacco Use   Smoking status: Never   Smokeless tobacco: Never  Vaping Use   Vaping status: Never Used  Substance Use Topics   Alcohol use: No    Alcohol/week: 0.0 standard drinks of alcohol   Drug use: No   Family History  Problem Relation Age of Onset   Colon cancer  Other        1st degree relative<60   Diabetes Other        1st degree relative   Hypertension Other    Prostate cancer Other        1st degree relative <50   Diverticulitis Mother    CAD Father 30   Allergies  Allergen Reactions   Lisinopril-Hydrochlorothiazide Other (See Comments)    Hot flashes   Ace Inhibitors     angioedema   Doxycycline    Iophen C-Nr [Guaifenesin-Codeine]    Penicillins    Sulfonamide Derivatives       ROS Negative unless stated above    Objective:     BP 134/80 (BP Location: Left Arm, Cuff Size: Normal)   Pulse 71   Temp 98.8 F (37.1 C) (Oral)   Ht 5' (1.524 m)   Wt 125 lb 3.2 oz (56.8 kg)   SpO2 97%   BMI 24.45 kg/m  BP Readings from Last 3 Encounters:  05/14/23 134/80  03/10/23 118/78  03/03/22 118/76   Wt Readings from Last 3 Encounters:  05/14/23 125 lb 3.2 oz (56.8 kg)  03/10/23 124 lb 12.8 oz (56.6 kg)  08/28/22 120 lb (54.4 kg)      Physical Exam Constitutional:      General: She is not in acute distress.    Appearance: Normal appearance.  HENT:     Head: Normocephalic  and atraumatic.     Nose: Nose normal.     Mouth/Throat:     Mouth: Mucous membranes are moist.  Cardiovascular:     Rate and Rhythm: Normal rate and regular rhythm.     Heart sounds: Normal heart sounds. No murmur heard.    No gallop.  Pulmonary:     Effort: Pulmonary effort is normal. No respiratory distress.     Breath sounds: Normal breath sounds. No wheezing, rhonchi or rales.  Abdominal:     General: Bowel sounds are normal.     Palpations: Abdomen is soft.     Tenderness: There is no abdominal tenderness. There is no right CVA tenderness or left CVA tenderness.  Skin:    General: Skin is warm and dry.  Neurological:     Mental Status: She is alert and oriented to person, place, and time.      Results for orders placed or performed in visit on 05/14/23  POCT Urinalysis Dipstick (Automated)  Result Value Ref Range   Color, UA yellow     Clarity, UA cloudy    Glucose, UA Negative Negative   Bilirubin, UA neg    Ketones, UA neg    Spec Grav, UA 1.025 1.010 - 1.025   Blood, UA 2+    pH, UA 5.5 5.0 - 8.0   Protein, UA Positive (A) Negative   Urobilinogen, UA 0.2 0.2 or 1.0 E.U./dL   Nitrite, UA pos    Leukocytes, UA Large (3+) (A) Negative      Assessment & Plan:  Acute cystitis with hematuria -     Urine Culture -     Ciprofloxacin HCl; Take 1 tablet (500 mg total) by mouth 2 (two) times daily for 5 days.  Dispense: 10 tablet; Refill: 0  Suspected UTI -     POCT Urinalysis Dipstick (Automated) -     Urine Culture  Constipation, unspecified constipation type  Patient with ongoing urinary symptoms.  POC UA with 2+ blood, protein, nitrites, 3+ leuks.  Obtain UCX.  Allergies to penicillin and sulfa drugs.  Tolerated Cipro in the past.  Start Cipro while awaiting culture results.  Advised to increase p.o. intake of water and fluids.  Given strict precautions.  Patient advised to take MiraLAX daily as needed for constipation and increase p.o. intake of water and fluids.  Return if symptoms worsen or fail to improve.   Deeann Saint, MD

## 2023-05-16 LAB — URINE CULTURE
MICRO NUMBER:: 15970331
SPECIMEN QUALITY:: ADEQUATE

## 2023-05-17 ENCOUNTER — Telehealth: Payer: Self-pay

## 2023-05-17 NOTE — Telephone Encounter (Signed)
Copied from CRM [id number]. Topic: Clinical - Lab/Test Results >> May 17, 2023  1:41 PM Mosetta Putt H wrote: Reason for CRM: pt called in would like results read to daughter

## 2023-05-18 NOTE — Telephone Encounter (Signed)
Spoke with pt daughter who is on pt DPR regarding pt Urine culture results, verbalized understanding, state that pt was having acid reflux and felt nauseous advised to take the Antibiotic with food and to drink plenty fluids. Advised to call the office if any issues arise.

## 2023-06-03 DIAGNOSIS — H6123 Impacted cerumen, bilateral: Secondary | ICD-10-CM | POA: Diagnosis not present

## 2023-09-01 ENCOUNTER — Ambulatory Visit: Payer: Medicare Other

## 2023-09-01 VITALS — Ht 60.0 in | Wt 125.0 lb

## 2023-09-01 DIAGNOSIS — Z0000 Encounter for general adult medical examination without abnormal findings: Secondary | ICD-10-CM

## 2023-09-01 NOTE — Progress Notes (Signed)
 Subjective:   Linda Mays is a 83 y.o. who presents for a Medicare Wellness preventive visit.  Visit Complete: Virtual I connected with  Linda Mays on 09/01/23 by a audio enabled telemedicine application and verified that I am speaking with the correct person using two identifiers.  Patient Location: Home  Provider Location: Home Office  I discussed the limitations of evaluation and management by telemedicine. The patient expressed understanding and agreed to proceed.  Vital Signs: Because this visit was a virtual/telehealth visit, some criteria may be missing or patient reported. Any vitals not documented were not able to be obtained and vitals that have been documented are patient reported.    Persons Participating in Visit: Patient.  AWV Questionnaire: No: Patient Medicare AWV questionnaire was not completed prior to this visit.  Cardiac Risk Factors include: advanced age (>65men, >55 women);hypertension     Objective:    Today's Vitals   09/01/23 0908  Weight: 125 lb (56.7 kg)  Height: 5' (1.524 m)   Body mass index is 24.41 kg/m.     09/01/2023    9:15 AM 08/28/2022    9:15 AM 08/26/2021    9:58 AM 08/06/2020   10:26 AM  Advanced Directives  Does Patient Have a Medical Advance Directive? No No Yes No  Type of Best boy of [place];Living will   Does patient want to make changes to medical advance directive?   No - Patient declined   Copy of Healthcare Power of Attorney in Chart?   No - copy requested   Would patient like information on creating a medical advance directive? No - Patient declined   No - Patient declined    Current Medications (verified) Outpatient Encounter Medications as of 09/01/2023  Medication Sig   amLODipine  (NORVASC ) 5 MG tablet Take 1 tablet (5 mg total) by mouth daily.   metoprolol  succinate (TOPROL -XL) 100 MG 24 hr tablet Take 1 tablet (100 mg total) by mouth in the morning and at bedtime.   SYNTHROID  50 MCG  tablet Take 1 tablet (50 mcg total) by mouth daily before breakfast.   No facility-administered encounter medications on file as of 09/01/2023.    Allergies (verified) Lisinopril -hydrochlorothiazide , Ace inhibitors, Doxycycline, Iophen c-nr [guaifenesin-codeine], Penicillins, and Sulfonamide derivatives   History: Past Medical History:  Diagnosis Date   Dyslipidemia    GERD (gastroesophageal reflux disease)    History of colonic polyps    Hypertension    Hypothyroidism    Osteoarthritis    Osteoporosis    TIA (transient ischemic attack) 11/06/2019   Past Surgical History:  Procedure Laterality Date   ABDOMINAL HYSTERECTOMY     Bladder tack procedure     BREAST LUMPECTOMY     OOPHORECTOMY     Family History  Problem Relation Age of Onset   Colon cancer Other        1st degree relative<60   Diabetes Other        1st degree relative   Hypertension Other    Prostate cancer Other        1st degree relative <50   Diverticulitis Mother    CAD Father 46   Social History   Socioeconomic History   Marital status: Divorced    Spouse name: Not on file   Number of children: 2   Years of education: Not on file   Highest education level: Not on file  Occupational History   Occupation: Retired    Associate Professor: RETIRED  Tobacco Use   Smoking status: Never   Smokeless tobacco: Never  Vaping Use   Vaping status: Never Used  Substance and Sexual Activity   Alcohol use: No    Alcohol/week: 0.0 standard drinks of alcohol   Drug use: No   Sexual activity: Not on file  Other Topics Concern   Not on file  Social History Narrative   Lives at home by herself   Social Drivers of Health   Financial Resource Strain: Low Risk  (09/01/2023)   Overall Financial Resource Strain (CARDIA)    Difficulty of Paying Living Expenses: Not hard at all  Food Insecurity: No Food Insecurity (09/01/2023)   Hunger Vital Sign    Worried About Running Out of Food in the Last Year: Never true    Ran Out  of Food in the Last Year: Never true  Transportation Needs: No Transportation Needs (09/01/2023)   PRAPARE - Administrator, Civil Service (Medical): No    Lack of Transportation (Non-Medical): No  Physical Activity: Inactive (09/01/2023)   Exercise Vital Sign    Days of Exercise per Week: 0 days    Minutes of Exercise per Session: 0 min  Stress: No Stress Concern Present (09/01/2023)   [employer] of Occupational Health - Occupational Stress Questionnaire    Feeling of Stress : Not at all  Social Connections: Moderately Integrated (09/01/2023)   Social Connection and Isolation Panel [NHANES]    Frequency of Communication with Friends and Family: More than three times a week    Frequency of Social Gatherings with Friends and Family: More than three times a week    Attends Religious Services: More than 4 times per year    Active Member of [employer] or Organizations: Yes    Attends Engineer, structural: More than 4 times per year    Marital Status: Divorced    Tobacco Counseling Counseling given: Not Answered    Clinical Intake:  Pre-visit preparation completed: Yes  Pain : No/denies pain     BMI - recorded: 24.41 Nutritional Status: BMI of 19-24  Normal Nutritional Risks: None Diabetes: No  Lab Results  Component Value Date   HGBA1C 5.4 03/22/2023   HGBA1C 5.4 10/30/2021   HGBA1C 5.1 06/15/2018     How often do you need to have someone help you when you read instructions, pamphlets, or other written materials from your doctor or pharmacy?: 1 - Never  Interpreter Needed?: No  Information entered by :: Farris Hong LPN   Activities of Daily Living     09/01/2023    9:14 AM  In your present state of health, do you have any difficulty performing the following activities:  Hearing? 0  Vision? 0  Difficulty concentrating or making decisions? 0  Walking or climbing stairs? 0  Dressing or bathing? 0  Doing errands, shopping? 0  Preparing Food and  eating ? N  Using the Toilet? N  In the past six months, have you accidently leaked urine? N  Do you have problems with loss of bowel control? N  Managing your Medications? N  Managing your Finances? N  Housekeeping or managing your Housekeeping? N    Patient Care Team: Donley Furth, MD as PCP - General  Indicate any recent Medical Services you may have received from other than [facility] providers in the past year (date may be approximate).     Assessment:   This is a routine wellness examination for Linda Mays.  Hearing/Vision screen  Hearing Screening - Comments:: Denies hearing difficulties   Vision Screening - Comments:: Wears rx glasses - up to date with routine eye exams with  [facility]   Goals Addressed               This Visit's Progress     Increase physical activity (pt-stated)        Remain active       Depression Screen     09/01/2023    9:12 AM 05/14/2023    2:12 PM 08/28/2022    9:16 AM 03/03/2022    3:25 PM 10/30/2021   10:28 AM 08/26/2021    9:54 AM 08/06/2020   10:25 AM  PHQ 2/9 Scores  PHQ - 2 Score 0 0 0 0 0 0 0  PHQ- 9 Score  0  0 0      Fall Risk     09/01/2023    9:14 AM 05/14/2023    2:11 PM 08/28/2022    9:16 AM 03/03/2022    3:26 PM 10/30/2021   10:28 AM  Fall Risk   Falls in the past year? 0 0 0 0 0  Number falls in past yr: 0 0 0 0 0  Injury with Fall? 0 0 0 0 0  Risk for fall due to : No Fall Risks No Fall Risks Medication side effect No Fall Risks No Fall Risks  Follow up Falls prevention discussed;Falls evaluation completed Falls evaluation completed Falls prevention discussed;Education provided;Falls evaluation completed Falls evaluation completed Falls evaluation completed    MEDICARE RISK AT HOME:  Medicare Risk at Home Any stairs in or around the home?: No If so, are there any without handrails?: No Home free of loose throw rugs in walkways, pet beds, electrical cords, etc?: Yes Adequate lighting in your home to reduce risk of falls?:  Yes Life alert?: No Use of a cane, walker or w/c?: No Grab bars in the bathroom?: Yes Shower chair or bench in shower?: No Elevated toilet seat or a handicapped toilet?: No  TIMED UP AND GO:  Was the test performed?  No  Cognitive Function: 6CIT completed        09/01/2023    9:15 AM 08/28/2022    9:17 AM 08/26/2021    9:59 AM 08/06/2020   10:31 AM  6CIT Screen  What Year? 0 points 0 points 0 points 0 points  What month? 0 points 0 points 0 points 0 points  What time? 0 points 0 points 0 points   Count back from 20 0 points 0 points 0 points 0 points  Months in reverse 0 points 4 points 0 points 0 points  Repeat phrase 0 points 0 points 0 points 6 points  Total Score 0 points 4 points 0 points     Immunizations Immunization History  Administered Date(s) Administered   Influenza Split 03/26/2011, 03/01/2012   Influenza Whole 02/07/2008, 01/30/2009, 01/22/2010   Influenza, High Dose Seasonal PF 04/01/2020   Influenza, Seasonal, Injecte, Preservative Fre 02/17/2023   Influenza,inj,Quad PF,6+ Mos 02/14/2013, 02/20/2014, 01/30/2015, 02/11/2016, 03/01/2017, 02/16/2018, 02/27/2019   Influenza-Unspecified 02/20/2022, 02/17/2023   Moderna Covid-19 Vaccine Bivalent Booster 65yrs & up 02/14/2021   Moderna Sars-Covid-2 Vaccination 05/22/2019, 06/16/2019, 02/23/2020   Tdap 09/03/2020    Screening Tests Health Maintenance  Topic Date Due   Zoster Vaccines- Shingrix (1 of 2) Never done   DEXA SCAN  Never done   COVID-19 Vaccine (5 - 2024-25 season) 12/27/2022   Pneumonia Vaccine 65+ Years  old (1 of 1 - PCV) 05/13/2024 (Originally 11/10/1990)   INFLUENZA VACCINE  11/26/2023   Medicare Annual Wellness (AWV)  08/31/2024   DTaP/Tdap/Td (2 - Td or Tdap) 09/04/2030   HPV VACCINES  Aged Out   Meningococcal B Vaccine  Aged Out    Health Maintenance  Health Maintenance Due  Topic Date Due   Zoster Vaccines- Shingrix (1 of 2) Never done   DEXA SCAN  Never done   COVID-19 Vaccine (5 -  2024-25 season) 12/27/2022   Health Maintenance Items Addressed: Patient declined Dexa Scan and Zoster/Shingles vaccine  Additional Screening:  Vision Screening: Recommended annual ophthalmology exams for early detection of glaucoma and other disorders of the eye.  Dental Screening: Recommended annual dental exams for proper oral hygiene  Community Resource Referral / Chronic Care Management: CRR required this visit?  No   CCM required this visit?  No     Plan:     I have personally reviewed and noted the following in the patient's chart:   Medical and social history Use of alcohol, tobacco or illicit drugs  Current medications and supplements including opioid prescriptions. Patient is not currently taking opioid prescriptions. Functional ability and status Nutritional status Physical activity Advanced directives List of other physicians Hospitalizations, surgeries, and ER visits in previous 12 months Vitals Screenings to include cognitive, depression, and falls Referrals and appointments  In addition, I have reviewed and discussed with patient certain preventive protocols, quality metrics, and best practice recommendations. A written personalized care plan for preventive services as well as general preventive health recommendations were provided to patient.     Dewayne Ford, LPN   07/27/3242   After Visit Summary: (MyChart) Due to this being a telephonic visit, the after visit summary with patients personalized plan was offered to patient via MyChart   Notes: Nothing significant to report at this time.

## 2023-09-01 NOTE — Patient Instructions (Addendum)
 Ms. Linda Mays , Thank you for taking time to come for your Medicare Wellness Visit. I appreciate your ongoing commitment to your health goals. Please review the following plan we discussed and let me know if I can assist you in the future.   Referrals/Orders/Follow-Ups/Clinician Recommendations:   This is a list of the screening recommended for you and due dates:  Health Maintenance  Topic Date Due   Zoster (Shingles) Vaccine (1 of 2) Never done   DEXA scan (bone density measurement)  Never done   COVID-19 Vaccine (5 - 2024-25 season) 12/27/2022   Pneumonia Vaccine (1 of 1 - PCV) 05/13/2024*   Flu Shot  11/26/2023   Medicare Annual Wellness Visit  08/31/2024   DTaP/Tdap/Td vaccine (2 - Td or Tdap) 09/04/2030   HPV Vaccine  Aged Out   Meningitis B Vaccine  Aged Out  *Topic was postponed. The date shown is not the original due date.    Advanced directives: (Declined) Advance directive discussed with you today. Even though you declined this today, please call our office should you change your mind, and we can give you the proper paperwork for you to fill out.  Next Medicare Annual Wellness Visit scheduled for next year: Yes

## 2023-09-30 ENCOUNTER — Encounter: Payer: Self-pay | Admitting: Family Medicine

## 2023-09-30 ENCOUNTER — Ambulatory Visit (INDEPENDENT_AMBULATORY_CARE_PROVIDER_SITE_OTHER): Admitting: Family Medicine

## 2023-09-30 VITALS — BP 110/62 | HR 63 | Temp 98.2°F | Wt 123.8 lb

## 2023-09-30 DIAGNOSIS — R682 Dry mouth, unspecified: Secondary | ICD-10-CM | POA: Diagnosis not present

## 2023-09-30 DIAGNOSIS — K219 Gastro-esophageal reflux disease without esophagitis: Secondary | ICD-10-CM

## 2023-09-30 DIAGNOSIS — H04123 Dry eye syndrome of bilateral lacrimal glands: Secondary | ICD-10-CM | POA: Insufficient documentation

## 2023-09-30 MED ORDER — TRIAMCINOLONE ACETONIDE 0.1 % EX CREA
1.0000 | TOPICAL_CREAM | Freq: Three times a day (TID) | CUTANEOUS | 2 refills | Status: AC
Start: 2023-09-30 — End: ?

## 2023-09-30 NOTE — Progress Notes (Signed)
   Subjective:    Patient ID: Linda Mays, female    DOB: 10/29/1940, 83 y.o.   MRN: [id number]  HPI Here to discuss problems with dry eyes and a dry mouth. For the past month she has experienced burning and itching in her eyes, which she has never had before. She has also had extreme dryness in her mouth for about a month. She also mentions that she has been having frequent mild heartburn the past few weeks, which is unusual for her. Her medications have not been changed for several years, and she does not take any supplements OTC. She has never had diabetes, and he last A1c in November was 5.4%. she saw her dentist a week ago, and she told Casandra that her mouth seemed extra dry. They also took Xrays that revealed multiple cavities, and this has never been a problem for her.    Review of Systems  Constitutional: Negative.   HENT:  Negative for congestion, postnasal drip, sore throat and trouble swallowing.   Eyes:  Positive for redness and itching. Negative for photophobia, discharge and visual disturbance.  Respiratory: Negative.    Cardiovascular: Negative.   Neurological: Negative.        Objective:   Physical Exam Constitutional:      Appearance: Normal appearance.  HENT:     Mouth/Throat:     Mouth: Mucous membranes are dry.     Pharynx: Oropharynx is clear.  Eyes:     Pupils: Pupils are equal, round, and reactive to light.     Comments: The right conjunctiva is slightly red   Cardiovascular:     Rate and Rhythm: Normal rate and regular rhythm.     Pulses: Normal pulses.     Heart sounds: Normal heart sounds.  Pulmonary:     Effort: Pulmonary effort is normal.     Breath sounds: Normal breath sounds.  Lymphadenopathy:     Cervical: No cervical adenopathy.  Neurological:     Mental Status: She is alert.           Assessment & Plan:  She has had the sudden development of dry eyes and dry mouth in the past few weeks. In addition, she has a new problem of dental  cavities, and she has been having some GERD symptoms. It is not clear whether these are related or not. I advised her to drink plenty of water and to keep some sugar free hard candy in her mouth. She can also use Refresh eye drops OTC. For the GERD, she will try Pepcid AC. A possible diagnosis that could tie all this together would be Sjogren's syndrome. We will refer her to Ophthalmology and to Rheumatology for these issues.  Corita Diego, MD

## 2023-11-04 DIAGNOSIS — M154 Erosive (osteo)arthritis: Secondary | ICD-10-CM | POA: Diagnosis not present

## 2023-11-04 DIAGNOSIS — D8989 Other specified disorders involving the immune mechanism, not elsewhere classified: Secondary | ICD-10-CM | POA: Diagnosis not present

## 2023-11-04 DIAGNOSIS — R682 Dry mouth, unspecified: Secondary | ICD-10-CM | POA: Diagnosis not present

## 2023-11-04 DIAGNOSIS — Z6824 Body mass index (BMI) 24.0-24.9, adult: Secondary | ICD-10-CM | POA: Diagnosis not present

## 2023-11-04 DIAGNOSIS — H04123 Dry eye syndrome of bilateral lacrimal glands: Secondary | ICD-10-CM | POA: Diagnosis not present

## 2023-11-08 LAB — LAB REPORT - SCANNED: EGFR: 80

## 2023-11-29 DIAGNOSIS — H04123 Dry eye syndrome of bilateral lacrimal glands: Secondary | ICD-10-CM | POA: Diagnosis not present

## 2023-11-29 DIAGNOSIS — R768 Other specified abnormal immunological findings in serum: Secondary | ICD-10-CM | POA: Diagnosis not present

## 2023-11-29 DIAGNOSIS — D8989 Other specified disorders involving the immune mechanism, not elsewhere classified: Secondary | ICD-10-CM | POA: Diagnosis not present

## 2023-11-29 DIAGNOSIS — M154 Erosive (osteo)arthritis: Secondary | ICD-10-CM | POA: Diagnosis not present

## 2023-11-29 DIAGNOSIS — R682 Dry mouth, unspecified: Secondary | ICD-10-CM | POA: Diagnosis not present

## 2023-11-29 DIAGNOSIS — R7989 Other specified abnormal findings of blood chemistry: Secondary | ICD-10-CM | POA: Diagnosis not present

## 2023-11-29 DIAGNOSIS — Z6824 Body mass index (BMI) 24.0-24.9, adult: Secondary | ICD-10-CM | POA: Diagnosis not present

## 2024-02-07 DIAGNOSIS — H04123 Dry eye syndrome of bilateral lacrimal glands: Secondary | ICD-10-CM | POA: Diagnosis not present

## 2024-02-07 DIAGNOSIS — H353131 Nonexudative age-related macular degeneration, bilateral, early dry stage: Secondary | ICD-10-CM | POA: Diagnosis not present

## 2024-02-07 DIAGNOSIS — H25813 Combined forms of age-related cataract, bilateral: Secondary | ICD-10-CM | POA: Diagnosis not present

## 2024-02-07 DIAGNOSIS — H5203 Hypermetropia, bilateral: Secondary | ICD-10-CM | POA: Diagnosis not present

## 2024-02-07 DIAGNOSIS — H52223 Regular astigmatism, bilateral: Secondary | ICD-10-CM | POA: Diagnosis not present

## 2024-02-07 DIAGNOSIS — H524 Presbyopia: Secondary | ICD-10-CM | POA: Diagnosis not present

## 2024-02-07 DIAGNOSIS — H3530 Unspecified macular degeneration: Secondary | ICD-10-CM | POA: Diagnosis not present

## 2024-02-16 DIAGNOSIS — H25811 Combined forms of age-related cataract, right eye: Secondary | ICD-10-CM | POA: Diagnosis not present

## 2024-02-16 DIAGNOSIS — H25812 Combined forms of age-related cataract, left eye: Secondary | ICD-10-CM | POA: Diagnosis not present

## 2024-03-01 DIAGNOSIS — Z23 Encounter for immunization: Secondary | ICD-10-CM | POA: Diagnosis not present

## 2024-03-07 DIAGNOSIS — Z01818 Encounter for other preprocedural examination: Secondary | ICD-10-CM | POA: Diagnosis not present

## 2024-03-07 DIAGNOSIS — H25811 Combined forms of age-related cataract, right eye: Secondary | ICD-10-CM | POA: Diagnosis not present

## 2024-03-13 ENCOUNTER — Other Ambulatory Visit: Payer: Self-pay | Admitting: Family Medicine

## 2024-03-13 NOTE — Telephone Encounter (Unsigned)
 Copied from CRM #[id number]. Topic: Clinical - Medication Refill >> Mar 13, 2024  2:54 PM Viola F wrote: Medication: metoprolol  succinate (TOPROL -XL) 100 MG 24 hr tablet [[id number]] amLODipine  (NORVASC ) 5 MG tablet [[id number]] SYNTHROID  50 MCG tablet [[id number]]  Has the patient contacted their pharmacy? Yes (Agent: If no, request that the patient contact the pharmacy for the refill. If patient does not wish to contact the pharmacy document the reason why and proceed with request.) (Agent: If yes, when and what did the pharmacy advise?)  This is the patient's preferred pharmacy:  [facility] [address] [place] - [address] [facility] [place] [address] Phone: [phone] Fax: [phone]  Is this the correct pharmacy for this prescription? Yes If no, delete pharmacy and type the correct one.   Has the prescription been filled recently? Yes  Is the patient out of the medication? No  Has the patient been seen for an appointment in the last year OR does the patient have an upcoming appointment? Yes  Can we respond through MyChart? No   Agent: Please be advised that Rx refills may take up to 3 business days. We ask that you follow-up with your pharmacy.

## 2024-03-14 MED ORDER — METOPROLOL SUCCINATE ER 100 MG PO TB24: 100.0000 mg | ORAL_TABLET | Freq: Two times a day (BID) | ORAL | 3 refills | Status: DC

## 2024-03-14 MED ORDER — AMLODIPINE BESYLATE 5 MG PO TABS: 5.0000 mg | ORAL_TABLET | Freq: Every day | ORAL | 3 refills | Status: DC

## 2024-03-14 MED ORDER — SYNTHROID 50 MCG PO TABS: 50.0000 ug | ORAL_TABLET | Freq: Every day | ORAL | 3 refills | Status: DC

## 2024-03-20 DIAGNOSIS — F418 Other specified anxiety disorders: Secondary | ICD-10-CM | POA: Diagnosis not present

## 2024-03-20 DIAGNOSIS — H25811 Combined forms of age-related cataract, right eye: Secondary | ICD-10-CM | POA: Diagnosis not present

## 2024-04-24 ENCOUNTER — Other Ambulatory Visit: Payer: Self-pay | Admitting: Family Medicine

## 2024-04-24 NOTE — Telephone Encounter (Signed)
 Copied from CRM [id number]. Topic: Clinical - Medication Refill >> Apr 24, 2024 11:58 AM Darshell M wrote: Medication:  SYNTHROID  50 MCG tablet   Has the patient contacted their pharmacy? Yes (Agent: If no, request that the patient contact the pharmacy for the refill. If patient does not wish to contact the pharmacy document the reason why and proceed with request.) (Agent: If yes, when and what did the pharmacy advise?)  This is the patient's preferred pharmacy:  [facility] [address] [place] - [address] [facility] [place] [address] Phone: [phone] Fax: [phone]  Is this the correct pharmacy for this prescription? Yes If no, delete pharmacy and type the correct one.   Has the prescription been filled recently? No  Is the patient out of the medication? Yes  Has the patient been seen for an appointment in the last year OR does the patient have an upcoming appointment? Yes  Can we respond through MyChart? Yes  Agent: Please be advised that Rx refills may take up to 3 business days. We ask that you follow-up with your pharmacy.

## 2024-04-24 NOTE — Telephone Encounter (Unsigned)
 Copied from CRM [id number]. Topic: Clinical - Medication Refill >> Apr 24, 2024 11:58 AM Darshell M wrote: Medication:  SYNTHROID  50 MCG tablet   Has the patient contacted their pharmacy? Yes (Agent: If no, request that the patient contact the pharmacy for the refill. If patient does not wish to contact the pharmacy document the reason why and proceed with request.) (Agent: If yes, when and what did the pharmacy advise?)  This is the patient's preferred pharmacy:  [facility] [address], [place] - [address] [facility] [place] [address] Phone: [phone] Fax: [phone]  Is this the correct pharmacy for this prescription? Yes If no, delete pharmacy and type the correct one.   Has the prescription been filled recently? No  Is the patient out of the medication? Yes  Has the patient been seen for an appointment in the last year OR does the patient have an upcoming appointment? Yes  Can we respond through MyChart? Yes  Agent: Please be advised that Rx refills may take up to 3 business days. We ask that you follow-up with your pharmacy.

## 2024-04-25 MED ORDER — SYNTHROID 50 MCG PO TABS: 50.0000 ug | ORAL_TABLET | Freq: Every day | ORAL | 3 refills | Status: AC

## 2024-09-06 ENCOUNTER — Ambulatory Visit
